# Patient Record
Sex: Male | Born: 1935 | Race: White | Hispanic: No | Marital: Married | Smoking: Never smoker
Health system: Southern US, Community
[De-identification: ages and names within clinical notes are randomized; demographics above are authoritative.]

---

## 2019-10-14 ENCOUNTER — Other Ambulatory Visit: Payer: Self-pay

## 2019-10-14 ENCOUNTER — Emergency Department (HOSPITAL_COMMUNITY): Payer: Medicare Other

## 2019-10-14 ENCOUNTER — Inpatient Hospital Stay (HOSPITAL_COMMUNITY): Payer: Medicare Other

## 2019-10-14 ENCOUNTER — Inpatient Hospital Stay (HOSPITAL_COMMUNITY): Payer: Medicare Other | Admitting: Anesthesiology

## 2019-10-14 ENCOUNTER — Encounter (HOSPITAL_COMMUNITY)
Admission: EM | Disposition: A | Discharge status: Hospice | Payer: Self-pay | Source: Home / Self Care | Attending: Neurology

## 2019-10-14 ENCOUNTER — Inpatient Hospital Stay (HOSPITAL_COMMUNITY)
Admission: EM | Admit: 2019-10-14 | Discharge: 2019-10-19 | DRG: 023 | Disposition: A | Discharge status: Hospice | Payer: Medicare Other | Attending: Neurology | Admitting: Neurology

## 2019-10-14 ENCOUNTER — Encounter (HOSPITAL_COMMUNITY): Payer: Self-pay | Admitting: Neurology

## 2019-10-14 DIAGNOSIS — I10 Essential (primary) hypertension: Secondary | ICD-10-CM | POA: Diagnosis present

## 2019-10-14 DIAGNOSIS — I639 Cerebral infarction, unspecified: Secondary | ICD-10-CM | POA: Diagnosis not present

## 2019-10-14 DIAGNOSIS — Z66 Do not resuscitate: Secondary | ICD-10-CM | POA: Diagnosis not present

## 2019-10-14 DIAGNOSIS — G9340 Encephalopathy, unspecified: Secondary | ICD-10-CM | POA: Diagnosis not present

## 2019-10-14 DIAGNOSIS — D6489 Other specified anemias: Secondary | ICD-10-CM | POA: Diagnosis present

## 2019-10-14 DIAGNOSIS — Z01818 Encounter for other preprocedural examination: Secondary | ICD-10-CM

## 2019-10-14 DIAGNOSIS — E785 Hyperlipidemia, unspecified: Secondary | ICD-10-CM | POA: Diagnosis present

## 2019-10-14 DIAGNOSIS — R471 Dysarthria and anarthria: Secondary | ICD-10-CM | POA: Diagnosis present

## 2019-10-14 DIAGNOSIS — Z515 Encounter for palliative care: Secondary | ICD-10-CM | POA: Diagnosis not present

## 2019-10-14 DIAGNOSIS — R1312 Dysphagia, oropharyngeal phase: Secondary | ICD-10-CM | POA: Diagnosis not present

## 2019-10-14 DIAGNOSIS — Z978 Presence of other specified devices: Secondary | ICD-10-CM

## 2019-10-14 DIAGNOSIS — C159 Malignant neoplasm of esophagus, unspecified: Secondary | ICD-10-CM | POA: Diagnosis not present

## 2019-10-14 DIAGNOSIS — R1310 Dysphagia, unspecified: Secondary | ICD-10-CM | POA: Diagnosis present

## 2019-10-14 DIAGNOSIS — D696 Thrombocytopenia, unspecified: Secondary | ICD-10-CM | POA: Diagnosis present

## 2019-10-14 DIAGNOSIS — R29810 Facial weakness: Secondary | ICD-10-CM | POA: Diagnosis present

## 2019-10-14 DIAGNOSIS — G8194 Hemiplegia, unspecified affecting left nondominant side: Secondary | ICD-10-CM | POA: Diagnosis present

## 2019-10-14 DIAGNOSIS — E43 Unspecified severe protein-calorie malnutrition: Secondary | ICD-10-CM | POA: Diagnosis not present

## 2019-10-14 DIAGNOSIS — I6322 Cerebral infarction due to unspecified occlusion or stenosis of basilar arteries: Secondary | ICD-10-CM | POA: Diagnosis not present

## 2019-10-14 DIAGNOSIS — J9600 Acute respiratory failure, unspecified whether with hypoxia or hypercapnia: Secondary | ICD-10-CM | POA: Diagnosis not present

## 2019-10-14 DIAGNOSIS — R29709 NIHSS score 9: Secondary | ICD-10-CM | POA: Diagnosis present

## 2019-10-14 DIAGNOSIS — G835 Locked-in state: Secondary | ICD-10-CM | POA: Diagnosis not present

## 2019-10-14 DIAGNOSIS — I361 Nonrheumatic tricuspid (valve) insufficiency: Secondary | ICD-10-CM

## 2019-10-14 DIAGNOSIS — Z20822 Contact with and (suspected) exposure to covid-19: Secondary | ICD-10-CM | POA: Diagnosis present

## 2019-10-14 DIAGNOSIS — I651 Occlusion and stenosis of basilar artery: Secondary | ICD-10-CM | POA: Diagnosis not present

## 2019-10-14 DIAGNOSIS — Z923 Personal history of irradiation: Secondary | ICD-10-CM

## 2019-10-14 DIAGNOSIS — I6302 Cerebral infarction due to thrombosis of basilar artery: Secondary | ICD-10-CM | POA: Diagnosis present

## 2019-10-14 DIAGNOSIS — J690 Pneumonitis due to inhalation of food and vomit: Secondary | ICD-10-CM | POA: Diagnosis not present

## 2019-10-14 DIAGNOSIS — J9601 Acute respiratory failure with hypoxia: Secondary | ICD-10-CM | POA: Diagnosis not present

## 2019-10-14 DIAGNOSIS — I63341 Cerebral infarction due to thrombosis of right cerebellar artery: Secondary | ICD-10-CM | POA: Diagnosis present

## 2019-10-14 DIAGNOSIS — R509 Fever, unspecified: Secondary | ICD-10-CM

## 2019-10-14 DIAGNOSIS — I69391 Dysphagia following cerebral infarction: Secondary | ICD-10-CM

## 2019-10-14 DIAGNOSIS — Z7982 Long term (current) use of aspirin: Secondary | ICD-10-CM

## 2019-10-14 DIAGNOSIS — D62 Acute posthemorrhagic anemia: Secondary | ICD-10-CM | POA: Diagnosis not present

## 2019-10-14 DIAGNOSIS — G8250 Quadriplegia, unspecified: Secondary | ICD-10-CM | POA: Diagnosis not present

## 2019-10-14 DIAGNOSIS — Z9221 Personal history of antineoplastic chemotherapy: Secondary | ICD-10-CM

## 2019-10-14 DIAGNOSIS — Z9911 Dependence on respirator [ventilator] status: Secondary | ICD-10-CM | POA: Diagnosis not present

## 2019-10-14 DIAGNOSIS — J988 Other specified respiratory disorders: Secondary | ICD-10-CM | POA: Diagnosis not present

## 2019-10-14 DIAGNOSIS — Z6822 Body mass index (BMI) 22.0-22.9, adult: Secondary | ICD-10-CM

## 2019-10-14 DIAGNOSIS — Z87891 Personal history of nicotine dependence: Secondary | ICD-10-CM

## 2019-10-14 DIAGNOSIS — I672 Cerebral atherosclerosis: Secondary | ICD-10-CM | POA: Diagnosis present

## 2019-10-14 DIAGNOSIS — E876 Hypokalemia: Secondary | ICD-10-CM | POA: Diagnosis not present

## 2019-10-14 DIAGNOSIS — W19XXXA Unspecified fall, initial encounter: Secondary | ICD-10-CM

## 2019-10-14 DIAGNOSIS — D509 Iron deficiency anemia, unspecified: Secondary | ICD-10-CM | POA: Diagnosis present

## 2019-10-14 DIAGNOSIS — Z823 Family history of stroke: Secondary | ICD-10-CM

## 2019-10-14 DIAGNOSIS — Z9289 Personal history of other medical treatment: Secondary | ICD-10-CM

## 2019-10-14 HISTORY — PX: IR PERCUTANEOUS ART THROMBECTOMY/INFUSION INTRACRANIAL INC DIAG ANGIO: IMG6087

## 2019-10-14 HISTORY — PX: RADIOLOGY WITH ANESTHESIA: SHX6223

## 2019-10-14 HISTORY — PX: IR CT HEAD LTD: IMG2386

## 2019-10-14 HISTORY — PX: IR US GUIDE VASC ACCESS RIGHT: IMG2390

## 2019-10-14 HISTORY — PX: IR ANGIO INTRA EXTRACRAN SEL COM CAROTID INNOMINATE UNI R MOD SED: IMG5359

## 2019-10-14 HISTORY — PX: IR ANGIO VERTEBRAL SEL SUBCLAVIAN INNOMINATE UNI L MOD SED: IMG5364

## 2019-10-14 LAB — APTT: aPTT: 25 seconds (ref 24–36)

## 2019-10-14 LAB — DIFFERENTIAL
Abs Immature Granulocytes: 0.03 10*3/uL (ref 0.00–0.07)
Basophils Absolute: 0 10*3/uL (ref 0.0–0.1)
Basophils Relative: 0 %
Eosinophils Absolute: 0.1 10*3/uL (ref 0.0–0.5)
Eosinophils Relative: 2 %
Immature Granulocytes: 0 %
Lymphocytes Relative: 5 %
Lymphs Abs: 0.4 10*3/uL — ABNORMAL LOW (ref 0.7–4.0)
Monocytes Absolute: 0.6 10*3/uL (ref 0.1–1.0)
Monocytes Relative: 8 %
Neutro Abs: 6.6 10*3/uL (ref 1.7–7.7)
Neutrophils Relative %: 85 %

## 2019-10-14 LAB — COMPREHENSIVE METABOLIC PANEL
ALT: 33 U/L (ref 0–44)
AST: 37 U/L (ref 15–41)
Albumin: 4 g/dL (ref 3.5–5.0)
Alkaline Phosphatase: 88 U/L (ref 38–126)
Anion gap: 16 — ABNORMAL HIGH (ref 5–15)
BUN: 25 mg/dL — ABNORMAL HIGH (ref 8–23)
CO2: 20 mmol/L — ABNORMAL LOW (ref 22–32)
Calcium: 9.4 mg/dL (ref 8.9–10.3)
Chloride: 103 mmol/L (ref 98–111)
Creatinine, Ser: 0.99 mg/dL (ref 0.61–1.24)
GFR calc Af Amer: >60 mL/min (ref >60)
GFR calc non Af Amer: >60 mL/min (ref >60)
Glucose, Bld: 129 mg/dL — ABNORMAL HIGH (ref 70–99)
Potassium: 4.6 mmol/L (ref 3.5–5.1)
Sodium: 139 mmol/L (ref 135–145)
Total Bilirubin: 0.6 mg/dL (ref 0.3–1.2)
Total Protein: 6.8 g/dL (ref 6.5–8.1)

## 2019-10-14 LAB — POCT I-STAT 7, (LYTES, BLD GAS, ICA,H+H)
Acid-base deficit: 6 mmol/L — ABNORMAL HIGH (ref 0.0–2.0)
Bicarbonate: 19.4 mmol/L — ABNORMAL LOW (ref 20.0–28.0)
Calcium, Ion: 1.16 mmol/L (ref 1.15–1.40)
HCT: 28 % — ABNORMAL LOW (ref 39.0–52.0)
Hemoglobin: 9.5 g/dL — ABNORMAL LOW (ref 13.0–17.0)
O2 Saturation: 98 %
Patient temperature: 97.5
Potassium: 3.8 mmol/L (ref 3.5–5.1)
Sodium: 139 mmol/L (ref 135–145)
TCO2: 20 mmol/L — ABNORMAL LOW (ref 22–32)
pCO2 arterial: 33.6 mmHg (ref 32.0–48.0)
pH, Arterial: 7.366 (ref 7.350–7.450)
pO2, Arterial: 103 mmHg (ref 83.0–108.0)

## 2019-10-14 LAB — CBC
HCT: 35.5 % — ABNORMAL LOW (ref 39.0–52.0)
Hemoglobin: 11.9 g/dL — ABNORMAL LOW (ref 13.0–17.0)
MCH: 29.7 pg (ref 26.0–34.0)
MCHC: 33.5 g/dL (ref 30.0–36.0)
MCV: 88.5 fL (ref 80.0–100.0)
Platelets: 173 10*3/uL (ref 150–400)
RBC: 4.01 MIL/uL — ABNORMAL LOW (ref 4.22–5.81)
RDW: 14.8 % (ref 11.5–15.5)
WBC: 7.7 10*3/uL (ref 4.0–10.5)
nRBC: 0 % (ref 0.0–0.2)

## 2019-10-14 LAB — MRSA PCR SCREENING: MRSA by PCR: NEGATIVE

## 2019-10-14 LAB — PROTIME-INR
INR: 1.1 (ref 0.8–1.2)
Prothrombin Time: 14.3 seconds (ref 11.4–15.2)

## 2019-10-14 LAB — ECHOCARDIOGRAM COMPLETE
Height: 66 in
Weight: 2186.96 oz

## 2019-10-14 LAB — RESPIRATORY PANEL BY RT PCR (FLU A&B, COVID)
Influenza A by PCR: NEGATIVE
Influenza B by PCR: NEGATIVE
SARS Coronavirus 2 by RT PCR: NEGATIVE

## 2019-10-14 LAB — CBG MONITORING, ED
Glucose-Capillary: 125 mg/dL — ABNORMAL HIGH (ref 70–99)
Glucose-Capillary: 119 mg/dL — ABNORMAL HIGH (ref 70–99)

## 2019-10-14 LAB — ETHANOL: Alcohol, Ethyl (B): <10 mg/dL (ref <10)

## 2019-10-14 SURGERY — IR WITH ANESTHESIA
Anesthesia: General

## 2019-10-14 MED ORDER — ASPIRIN 81 MG PO CHEW
81.0000 mg | CHEWABLE_TABLET | Freq: Every day | ORAL | Status: DC
  Administered 2019-10-15: 11:00:00 81 mg
  Filled 2019-10-14 (×2): qty 1

## 2019-10-14 MED ORDER — LIDOCAINE HCL (CARDIAC) PF 100 MG/5ML IV SOSY: PREFILLED_SYRINGE | INTRAVENOUS | Status: DC | PRN

## 2019-10-14 MED ORDER — CHLORHEXIDINE GLUCONATE 0.12% ORAL RINSE (MEDLINE KIT)
15.0000 mL | Freq: Two times a day (BID) | OROMUCOSAL | Status: DC
  Administered 2019-10-14 – 2019-10-17 (×7): 15 mL via OROMUCOSAL

## 2019-10-14 MED ORDER — CEFAZOLIN SODIUM-DEXTROSE 2-3 GM-%(50ML) IV SOLR
INTRAVENOUS | Status: DC | PRN
  Administered 2019-10-14: 2 g via INTRAVENOUS

## 2019-10-14 MED ORDER — PHENYLEPHRINE HCL-NACL 10-0.9 MG/250ML-% IV SOLN: 0.0000 ug/min | INTRAVENOUS | Status: DC

## 2019-10-14 MED ORDER — ROCURONIUM BROMIDE 100 MG/10ML IV SOLN
INTRAVENOUS | Status: DC | PRN
  Administered 2019-10-14: 50 mg via INTRAVENOUS
  Administered 2019-10-14: 100 mg via INTRAVENOUS

## 2019-10-14 MED ORDER — EPTIFIBATIDE 20 MG/10ML IV SOLN
INTRAVENOUS | Status: DC | PRN
  Administered 2019-10-14 (×4): 1.5 mg via INTRAVENOUS

## 2019-10-14 MED ORDER — ACETAMINOPHEN 650 MG RE SUPP: 650.0000 mg | RECTAL | Status: DC | PRN

## 2019-10-14 MED ORDER — CLEVIDIPINE BUTYRATE 0.5 MG/ML IV EMUL
0.0000 mg/h | INTRAVENOUS | Status: DC
  Administered 2019-10-14: 14:00:00 8 mg/h via INTRAVENOUS
  Administered 2019-10-14: 12:00:00 6 mg/h via INTRAVENOUS
  Filled 2019-10-14: qty 50

## 2019-10-14 MED ORDER — IOHEXOL 300 MG/ML  SOLN
150.0000 mL | Freq: Once | INTRAMUSCULAR | Status: AC | PRN
  Administered 2019-10-14: 09:00:00 50 mL via INTRA_ARTERIAL

## 2019-10-14 MED ORDER — SODIUM CHLORIDE 0.9 % IV SOLN: INTRAVENOUS | Status: DC | PRN

## 2019-10-14 MED ORDER — ASPIRIN 300 MG RE SUPP: 300.0000 mg | Freq: Every day | RECTAL | Status: DC

## 2019-10-14 MED ORDER — VERAPAMIL HCL 2.5 MG/ML IV SOLN
INTRAVENOUS | Status: DC | PRN
  Administered 2019-10-14: 2.5 mg via INTRA_ARTERIAL

## 2019-10-14 MED ORDER — PANTOPRAZOLE SODIUM 40 MG PO PACK
40.0000 mg | PACK | Freq: Every day | ORAL | Status: DC
  Administered 2019-10-14 – 2019-10-15 (×2): 40 mg
  Filled 2019-10-14 (×2): qty 20

## 2019-10-14 MED ORDER — ASPIRIN 81 MG PO CHEW: 81.0000 mg | CHEWABLE_TABLET | Freq: Every day | ORAL | Status: DC

## 2019-10-14 MED ORDER — ACETAMINOPHEN 160 MG/5ML PO SOLN: 650.0000 mg | ORAL | Status: DC | PRN

## 2019-10-14 MED ORDER — PHENYLEPHRINE 40 MCG/ML (10ML) SYRINGE FOR IV PUSH (FOR BLOOD PRESSURE SUPPORT)
PREFILLED_SYRINGE | INTRAVENOUS | Status: DC | PRN
  Administered 2019-10-14: 80 ug via INTRAVENOUS
  Administered 2019-10-14: 120 ug via INTRAVENOUS

## 2019-10-14 MED ORDER — ASPIRIN 81 MG PO CHEW
CHEWABLE_TABLET | ORAL | Status: DC | PRN
  Administered 2019-10-14: 81 mg

## 2019-10-14 MED ORDER — HEPARIN SODIUM (PORCINE) 1000 UNIT/ML IJ SOLN
INTRAMUSCULAR | Status: AC
  Filled 2019-10-14: qty 1

## 2019-10-14 MED ORDER — PROPOFOL 500 MG/50ML IV EMUL
INTRAVENOUS | Status: DC | PRN
  Administered 2019-10-14: 75 ug/kg/min via INTRAVENOUS

## 2019-10-14 MED ORDER — SODIUM CHLORIDE (PF) 0.9 % IJ SOLN
INTRAVENOUS | Status: DC | PRN
  Administered 2019-10-14 (×2): 200 ug via INTRA_ARTERIAL

## 2019-10-14 MED ORDER — NITROGLYCERIN 1 MG/10 ML FOR IR/CATH LAB
INTRA_ARTERIAL | Status: AC
  Filled 2019-10-14: qty 10

## 2019-10-14 MED ORDER — PROPOFOL 1000 MG/100ML IV EMUL
0.0000 ug/kg/min | INTRAVENOUS | Status: DC
  Administered 2019-10-14: 17:00:00 30 ug/kg/min via INTRAVENOUS
  Administered 2019-10-14: 16:00:00 15 ug/kg/min via INTRAVENOUS
  Administered 2019-10-15: 11:00:00 10 ug/kg/min via INTRAVENOUS
  Administered 2019-10-15: 50 ug/kg/min via INTRAVENOUS
  Filled 2019-10-14 (×3): qty 100

## 2019-10-14 MED ORDER — STROKE: EARLY STAGES OF RECOVERY BOOK
Freq: Once | Status: DC
  Filled 2019-10-14: qty 1

## 2019-10-14 MED ORDER — CLEVIDIPINE BUTYRATE 0.5 MG/ML IV EMUL
INTRAVENOUS | Status: AC
  Filled 2019-10-14: qty 50

## 2019-10-14 MED ORDER — SENNOSIDES-DOCUSATE SODIUM 8.6-50 MG PO TABS: 1.0000 | ORAL_TABLET | Freq: Every evening | ORAL | Status: DC | PRN

## 2019-10-14 MED ORDER — TICAGRELOR 90 MG PO TABS: 90.0000 mg | ORAL_TABLET | Freq: Two times a day (BID) | ORAL | Status: DC

## 2019-10-14 MED ORDER — ASPIRIN 81 MG PO CHEW
CHEWABLE_TABLET | ORAL | Status: AC
  Filled 2019-10-14: qty 1

## 2019-10-14 MED ORDER — IOHEXOL 300 MG/ML  SOLN
150.0000 mL | Freq: Once | INTRAMUSCULAR | Status: AC | PRN
  Administered 2019-10-14: 08:00:00 75 mL via INTRA_ARTERIAL

## 2019-10-14 MED ORDER — FENTANYL CITRATE (PF) 100 MCG/2ML IJ SOLN: 25.0000 ug | INTRAMUSCULAR | Status: DC | PRN

## 2019-10-14 MED ORDER — LIDOCAINE 2% (20 MG/ML) 5 ML SYRINGE
INTRAMUSCULAR | Status: DC | PRN
  Administered 2019-10-14: 60 mg via INTRAVENOUS

## 2019-10-14 MED ORDER — SODIUM CHLORIDE 0.9 % IV SOLN
INTRAVENOUS | Status: DC
  Administered 2019-10-16: 21:00:00 1000 mL via INTRAVENOUS

## 2019-10-14 MED ORDER — TICAGRELOR 90 MG PO TABS
ORAL_TABLET | ORAL | Status: AC
  Filled 2019-10-14: qty 2

## 2019-10-14 MED ORDER — ACETAMINOPHEN 160 MG/5ML PO SOLN
650.0000 mg | ORAL | Status: DC | PRN
Start: 2019-10-14 — End: 2019-10-15
  Administered 2019-10-15: 650 mg
  Filled 2019-10-14: qty 20.3

## 2019-10-14 MED ORDER — TICAGRELOR 90 MG PO TABS
90.0000 mg | ORAL_TABLET | Freq: Two times a day (BID) | ORAL | Status: DC
  Administered 2019-10-14 – 2019-10-15 (×2): 90 mg
  Filled 2019-10-14 (×2): qty 1

## 2019-10-14 MED ORDER — PROPOFOL 10 MG/ML IV BOLUS
INTRAVENOUS | Status: DC | PRN
  Administered 2019-10-14: 110 ug via INTRAVENOUS
  Administered 2019-10-14: 90 ug via INTRAVENOUS

## 2019-10-14 MED ORDER — PHENYLEPHRINE HCL-NACL 10-0.9 MG/250ML-% IV SOLN
INTRAVENOUS | Status: DC | PRN
  Administered 2019-10-14: 15 ug/min via INTRAVENOUS

## 2019-10-14 MED ORDER — ACETAMINOPHEN 650 MG RE SUPP
650.0000 mg | RECTAL | Status: DC | PRN
  Administered 2019-10-16 (×2): 650 mg via RECTAL
  Filled 2019-10-14 (×2): qty 1

## 2019-10-14 MED ORDER — ORAL CARE MOUTH RINSE
15.0000 mL | OROMUCOSAL | Status: DC
  Administered 2019-10-14 – 2019-10-19 (×42): 15 mL via OROMUCOSAL

## 2019-10-14 MED ORDER — VERAPAMIL HCL 2.5 MG/ML IV SOLN
INTRAVENOUS | Status: AC
  Filled 2019-10-14: qty 2

## 2019-10-14 MED ORDER — ONDANSETRON HCL 4 MG/2ML IJ SOLN: 4.0000 mg | Freq: Four times a day (QID) | INTRAMUSCULAR | Status: DC | PRN

## 2019-10-14 MED ORDER — CHLORHEXIDINE GLUCONATE CLOTH 2 % EX PADS
6.0000 | MEDICATED_PAD | Freq: Every day | CUTANEOUS | Status: DC
  Administered 2019-10-14: 12:00:00 6 via TOPICAL

## 2019-10-14 MED ORDER — ACETAMINOPHEN 325 MG PO TABS: 650.0000 mg | ORAL_TABLET | ORAL | Status: DC | PRN

## 2019-10-14 MED ORDER — SUCCINYLCHOLINE CHLORIDE 20 MG/ML IJ SOLN
INTRAMUSCULAR | Status: DC | PRN
  Administered 2019-10-14: 20 mg via INTRAVENOUS

## 2019-10-14 MED ORDER — TICAGRELOR 60 MG PO TABS
ORAL_TABLET | ORAL | Status: DC | PRN
  Administered 2019-10-14: 180 mg via NASOGASTRIC

## 2019-10-14 MED ORDER — IOHEXOL 300 MG/ML  SOLN
150.0000 mL | Freq: Once | INTRAMUSCULAR | Status: AC | PRN
  Administered 2019-10-14: 11:00:00 50 mL via INTRA_ARTERIAL

## 2019-10-14 MED ORDER — HEPARIN SODIUM (PORCINE) 1000 UNIT/ML IJ SOLN
INTRAMUSCULAR | Status: DC | PRN
  Administered 2019-10-14: 2000 [IU] via INTRA_ARTERIAL

## 2019-10-14 MED ORDER — EPTIFIBATIDE 20 MG/10ML IV SOLN
INTRAVENOUS | Status: AC
  Filled 2019-10-14: qty 10

## 2019-10-14 MED ORDER — IOHEXOL 350 MG/ML SOLN
100.0000 mL | Freq: Once | INTRAVENOUS | Status: AC | PRN
  Administered 2019-10-14: 100 mL via INTRAVENOUS

## 2019-10-14 MED ORDER — ASPIRIN 325 MG PO TABS: 325.0000 mg | ORAL_TABLET | Freq: Every day | ORAL | Status: DC

## 2019-10-14 MED ORDER — CEFAZOLIN SODIUM-DEXTROSE 2-4 GM/100ML-% IV SOLN
INTRAVENOUS | Status: AC
  Filled 2019-10-14: qty 100

## 2019-10-14 MED ORDER — SODIUM CHLORIDE 0.9 % IV SOLN: INTRAVENOUS | Status: DC

## 2019-10-14 NOTE — Progress Notes (Signed)
STROKE TEAM PROGRESS NOTE   INTERVAL HISTORY I personally reviewed history of presenting illness, electronic medical records and imaging films in PACS.  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  Remains intubated postprocedure and sedated.  Blood pressure adequately controlled and Cardene drip has been started.  No family available at the bedside.  Vitals:   10/14/19 0615 10/14/19 0641 10/14/19 0643 10/14/19 0646  BP: (!) 169/76   (!) 164/79  Pulse:   (!) 122   Resp: (!) 25  18   Temp:   (!) 97.5 F (36.4 C)   SpO2:    96%  Weight:  59 kg    Height:  5\' 6"  (1.676 m)      CBC:  Recent Labs  Lab 10/14/19 0503  WBC 7.7  NEUTROABS 6.6  HGB 11.9*  HCT 35.5*  MCV 88.5  PLT [id number]    Basic Metabolic Panel:  Recent Labs  Lab 10/14/19 0503  NA 139  K 4.6  CL 103  CO2 20*  GLUCOSE 129*  BUN 25*  CREATININE 0.99  CALCIUM 9.4   Lipid Panel: No results found for: CHOL, TRIG, HDL, CHOLHDL, VLDL, LDLCALC HgbA1c: No results found for: HGBA1C Urine Drug Screen: No results found for: LABOPIA, COCAINSCRNUR, LABBENZ, AMPHETMU, THCU, LABBARB  Alcohol Level     Component Value Date/Time   ETH <10 10/14/2019 0503    IMAGING past 48 hours CT Code Stroke CTA Head W/WO contrast  Result Date: 10/14/2019 CLINICAL DATA:  Slurred speech and left-sided weakness EXAM: CT ANGIOGRAPHY HEAD AND NECK CT PERFUSION BRAIN TECHNIQUE: Multidetector CT imaging of the head and neck was performed using the standard protocol during bolus administration of intravenous contrast. Multiplanar CT image reconstructions and MIPs were obtained to evaluate the vascular anatomy. Carotid stenosis measurements (when applicable) are obtained utilizing NASCET criteria, using the distal internal carotid diameter as the denominator. Multiphase CT imaging of the brain was performed following IV bolus contrast injection. Subsequent parametric perfusion  maps were calculated using RAPID software. CONTRAST:  Dose is currently not available COMPARISON:  Noncontrast head CT earlier today FINDINGS: CTA NECK FINDINGS Aortic arch: Atherosclerotic plaque that is extensive. Four vessel branching. Right carotid system: Relatively mild atherosclerotic plaque at the bifurcation without flow limiting stenosis. No ulceration. Left carotid system: Partial retropharyngeal course. Relatively mild atherosclerotic calcification mainly at the bifurcation without flow limiting stenosis or ulceration. Vertebral arteries: Left vertebral artery arises from the arch. Moderate atheromatous type narrowing at the right V1 segment. Both vertebral arteries are patent at the level of the dural penetration. Skeleton: Spinal degeneration and spondylosis. No acute or aggressive finding Other neck: Port in place. Upper chest: Distended esophagus containing semi solid appearing material were covered. Review of the MIP images confirms the above findings CTA HEAD FINDINGS Anterior circulation: Atherosclerotic plaque along the carotid siphons. No branch occlusion, beading, or aneurysm. Posterior circulation: Dominant right vertebral artery. There is high-grade narrowing of the left V4 segment. Although the basilar is likely hypoplastic in the setting of fetal type left PCA, there is superimposed thready flow followed by occlusion with no flow seen at the basilar tip or throughout the large majority of the right PCA. High-grade left P2 segment stenosis. Venous sinuses: Patent Anatomic variants: As above Review of the MIP images confirms the above findings CT Brain Perfusion Findings: ASPECTS: 10 CBF (<30%) Volume: 72mL Perfusion (Tmax>6.0s) volume: 40mL-at the level of the pons Critical Value/emergent results were called  by telephone at the time of interpretation on 10/14/2019 at 5:28 am to [place] , who verbally acknowledged these results. IMPRESSION: 1. Occluded mid to distal basilar  and right PCA. There is correlative abnormal perfusion at the level of the upper pons. 2. High-grade left P2 and left V4 segment stenoses. 3. No flow limiting stenosis or large vessel occlusion in the anterior circulation. 4. Distended upper thoracic esophagus likely containing debris. Electronically Signed   By: Monte Fantasia M.D.   On: 10/14/2019 05:32   CT Code Stroke CTA Neck W/WO contrast  Result Date: 10/14/2019 CLINICAL DATA:  Slurred speech and left-sided weakness EXAM: CT ANGIOGRAPHY HEAD AND NECK CT PERFUSION BRAIN TECHNIQUE: Multidetector CT imaging of the head and neck was performed using the standard protocol during bolus administration of intravenous contrast. Multiplanar CT image reconstructions and MIPs were obtained to evaluate the vascular anatomy. Carotid stenosis measurements (when applicable) are obtained utilizing NASCET criteria, using the distal internal carotid diameter as the denominator. Multiphase CT imaging of the brain was performed following IV bolus contrast injection. Subsequent parametric perfusion maps were calculated using RAPID software. CONTRAST:  Dose is currently not available COMPARISON:  Noncontrast head CT earlier today FINDINGS: CTA NECK FINDINGS Aortic arch: Atherosclerotic plaque that is extensive. Four vessel branching. Right carotid system: Relatively mild atherosclerotic plaque at the bifurcation without flow limiting stenosis. No ulceration. Left carotid system: Partial retropharyngeal course. Relatively mild atherosclerotic calcification mainly at the bifurcation without flow limiting stenosis or ulceration. Vertebral arteries: Left vertebral artery arises from the arch. Moderate atheromatous type narrowing at the right V1 segment. Both vertebral arteries are patent at the level of the dural penetration. Skeleton: Spinal degeneration and spondylosis. No acute or aggressive finding Other neck: Port in place. Upper chest: Distended esophagus containing semi solid  appearing material were covered. Review of the MIP images confirms the above findings CTA HEAD FINDINGS Anterior circulation: Atherosclerotic plaque along the carotid siphons. No branch occlusion, beading, or aneurysm. Posterior circulation: Dominant right vertebral artery. There is high-grade narrowing of the left V4 segment. Although the basilar is likely hypoplastic in the setting of fetal type left PCA, there is superimposed thready flow followed by occlusion with no flow seen at the basilar tip or throughout the large majority of the right PCA. High-grade left P2 segment stenosis. Venous sinuses: Patent Anatomic variants: As above Review of the MIP images confirms the above findings CT Brain Perfusion Findings: ASPECTS: 10 CBF (<30%) Volume: 21mL Perfusion (Tmax>6.0s) volume: 59mL-at the level of the pons Critical Value/emergent results were called by telephone at the time of interpretation on 10/14/2019 at 5:28 am to [place] , who verbally acknowledged these results. IMPRESSION: 1. Occluded mid to distal basilar and right PCA. There is correlative abnormal perfusion at the level of the upper pons. 2. High-grade left P2 and left V4 segment stenoses. 3. No flow limiting stenosis or large vessel occlusion in the anterior circulation. 4. Distended upper thoracic esophagus likely containing debris. Electronically Signed   By: Monte Fantasia M.D.   On: 10/14/2019 05:32   CT C-SPINE NO CHARGE  Result Date: 10/14/2019 CLINICAL DATA:  Neurologic deficits, fall EXAM: CT CERVICAL SPINE WITHOUT CONTRAST TECHNIQUE: Multidetector CT imaging of the cervical spine was performed without intravenous contrast. Multiplanar CT image reconstructions were also generated. COMPARISON:  Same day CT angiography of the head neck FINDINGS: Alignment: Slight exaggeration of the normal cervical lordosis. Mild anterolisthesis of C4 on C5 likely on a facet degenerative basis. No  abnormally perched, jump to widened facets.  Craniocervical into axial alignment is maintained. Skull base and vertebrae: Degenerative appearing fusion of the left articular facet at C2-3. Likely degenerative fusion of the right articular facets at C5-6 as well. No acute fracture. No primary bone lesion or focal pathologic process. Soft tissues and spinal canal: No pre or paravertebral fluid or swelling. No visible canal hematoma. Disc levels: Multilevel moderate to severe intervertebral disc height loss with vacuum phenomenon at C3-4 and C6-7 as well as multilevel cervical spondylitic endplate changes with advanced uncinate spurring and facet hypertrophic changes well. Findings result in at least moderate canal stenosis at C5-6 and C6-7 as well as multilevel mild-to-moderate neural foraminal narrowing. Degenerative changes are milder in the upper thoracic spine. Upper chest: Secretions noted in the posterior oropharynx. Debris noted in the upper thoracic esophagus. Biapical pleuroparenchymal scarring is present. Other: Thyroid gland is unremarkable. Cervical carotid atherosclerosis better assessed on CT angiography. IMPRESSION: 1. No acute osseous abnormality or traumatic listhesis. 2. Slight exaggeration of the normal cervical lordosis with mild anterolisthesis of C4 on C5 likely on a facet degenerative basis. Degenerative fusion of the left articular facet at C2-3 and right articular facet at C5-6. 3. Multilevel moderate to severe cervical spondylosis with advanced uncinate spurring and facet hypertrophic changes resulting in at least moderate canal stenosis at C5-6 and C6-7 as well as multilevel mild-to-moderate neural foraminal narrowing. 4. Secretions noted in the upper thoracic esophagus. Correlate for reflux. 5. Cervical carotid atherosclerosis better assessed on CT angiography. Electronically Signed   By: Lovena Le M.D.   On: 10/14/2019 05:57   CT Code Stroke Cerebral Perfusion with contrast  Result Date: 10/14/2019 CLINICAL DATA:  Slurred  speech and left-sided weakness EXAM: CT ANGIOGRAPHY HEAD AND NECK CT PERFUSION BRAIN TECHNIQUE: Multidetector CT imaging of the head and neck was performed using the standard protocol during bolus administration of intravenous contrast. Multiplanar CT image reconstructions and MIPs were obtained to evaluate the vascular anatomy. Carotid stenosis measurements (when applicable) are obtained utilizing NASCET criteria, using the distal internal carotid diameter as the denominator. Multiphase CT imaging of the brain was performed following IV bolus contrast injection. Subsequent parametric perfusion maps were calculated using RAPID software. CONTRAST:  Dose is currently not available COMPARISON:  Noncontrast head CT earlier today FINDINGS: CTA NECK FINDINGS Aortic arch: Atherosclerotic plaque that is extensive. Four vessel branching. Right carotid system: Relatively mild atherosclerotic plaque at the bifurcation without flow limiting stenosis. No ulceration. Left carotid system: Partial retropharyngeal course. Relatively mild atherosclerotic calcification mainly at the bifurcation without flow limiting stenosis or ulceration. Vertebral arteries: Left vertebral artery arises from the arch. Moderate atheromatous type narrowing at the right V1 segment. Both vertebral arteries are patent at the level of the dural penetration. Skeleton: Spinal degeneration and spondylosis. No acute or aggressive finding Other neck: Port in place. Upper chest: Distended esophagus containing semi solid appearing material were covered. Review of the MIP images confirms the above findings CTA HEAD FINDINGS Anterior circulation: Atherosclerotic plaque along the carotid siphons. No branch occlusion, beading, or aneurysm. Posterior circulation: Dominant right vertebral artery. There is high-grade narrowing of the left V4 segment. Although the basilar is likely hypoplastic in the setting of fetal type left PCA, there is superimposed thready flow  followed by occlusion with no flow seen at the basilar tip or throughout the large majority of the right PCA. High-grade left P2 segment stenosis. Venous sinuses: Patent Anatomic variants: As above Review of the MIP images confirms  the above findings CT Brain Perfusion Findings: ASPECTS: 10 CBF (<30%) Volume: 54mL Perfusion (Tmax>6.0s) volume: 22mL-at the level of the pons Critical Value/emergent results were called by telephone at the time of interpretation on 10/14/2019 at 5:28 am to [place] , who verbally acknowledged these results. IMPRESSION: 1. Occluded mid to distal basilar and right PCA. There is correlative abnormal perfusion at the level of the upper pons. 2. High-grade left P2 and left V4 segment stenoses. 3. No flow limiting stenosis or large vessel occlusion in the anterior circulation. 4. Distended upper thoracic esophagus likely containing debris. Electronically Signed   By: Monte Fantasia M.D.   On: 10/14/2019 05:32   CT HEAD CODE STROKE WO CONTRAST  Result Date: 10/14/2019 CLINICAL DATA:  Code stroke.  Slurred speech and left-sided weakness EXAM: CT HEAD WITHOUT CONTRAST TECHNIQUE: Contiguous axial images were obtained from the base of the skull through the vertex without intravenous contrast. COMPARISON:  None. FINDINGS: Brain: No evidence of acute infarction, hemorrhage, hydrocephalus, extra-axial collection or mass lesion/mass effect. Mild chronic small vessel ischemia and volume loss for age. Vascular: No hyperdense vessel or unexpected calcification. Skull: Normal. Negative for fracture or focal lesion. Sinuses/Orbits: Negative Other: These results were communicated to Dr. Leonel Ramsay at 5:12 amon 1/7/2021by text page via the [facility] messaging system. ASPECTS [facility] Stroke Program Early CT Score) - Ganglionic level infarction (caudate, lentiform nuclei, internal capsule, insula, M1-M3 cortex): 7 - Supraganglionic infarction (M4-M6 cortex): 3 Total score (0-10 with 10 being  normal): 10 IMPRESSION: No acute finding.  ASPECTS is 10. Electronically Signed   By: Monte Fantasia M.D.   On: 10/14/2019 05:13   Cerebral Angio 10/14/2019 S/P revascularization of occluded prox basilar artery with intracranial stentin for underlying severe ICAD. RT radial approach. Bilateral CCA and bilateral VERT artery angiograms.  PHYSICAL EXAM Patient is sedated and intubated.  Not in distress. . Afebrile. Head is nontraumatic. Neck is supple without bruit.    Cardiac exam no murmur or gallop. Lungs are clear to auscultation. Distal pulses are well felt. Neurological Exam :  Patient is sedated and intubated.  Eyes are closed.  Partially opens eyes to sternal rub.  Eyes in primary position.  Pupils are equal reactive.  Doll's eye movements are sluggish.  Corneal reflexes are sluggish.  He has a weak cough and gag.  He has some spontaneous left upper and lower extremity movements which are semipurposeful.  He has right hemiplegia.  There is minimum withdrawal in the right lower extremity and none in the right upper extremity to painful stimulus.  Deep tendon reflexes are depressed.  Plantars are equivocal. ASSESSMENT/PLAN [name] is a 86 y.o. male with history of esophageal cancer presenting with L sided weakness following a fall. Taken to IR for BA occlusion.   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 ASPECTS 10.     CTA head & neck mid to distal BA occlusion and R PCA occlusion. High-grade L P2 and L V4 stenoses.   CT perfusion abnormal level of upper pons  CT CS no trauma noted. Slight exaggeration of cervical lordosis. Fusion L  C2-3 and R C5-6. Moderate to severe cervical spondylosis w/ moderate canal stenosis C5-6 and C6-7.  Cerebral angio proximal BA occlusion s/p stent for intracranial atherosclerosis   Post IR CT no evidence of ICH or mass effect or vent prominence  MRI  pending    2D Echo  pending   LDL pending   HgbA1c pending   SCDs for VTE prophylaxis  no antithrombotics prior to admission, now on aspirin 81 mg daily and Brilinta (ticagrelor) 90 mg bid.   Therapy recommendations:  pending   Disposition:  pending   Acute Respiratory Failure  Intubated for IR  Left intubated for airway protection  Blood Pressure  No hx HTN  Not on home HTN meds   BP goal per IR x 24h following IR procedure   Dysphagia . Secondary to stroke . NPO  Other Stroke Risk Factors  Advanced age  Former smoker, quit 1979  Former smokeless tobacco user  Family hx stroke (father)  Other Active Problems  Esophageal cancer T2 N0 M0 adenocarcinoma 2019 w/ recurrence and T2 lesion not amenable to resection -currently undergoing chemotherapy with FOLFOX and Herceptin at [facility]  Acute blood loss anemia post IR, Hgb 11.9->9.5  Fe deficiency anemia on po iron  Hx GIB yrs ago  Hospital day # 0 I have personally obtained history,examined this patient, reviewed notes, independently viewed imaging studies, participated in medical decision making and plan of care.ROS completed by me personally and pertinent positives fully documented  I have made any additions or clarifications directly to the above note.  He presented with dysarthria dysphagia from basilar artery occlusion and underwent successful mechanical thrombectomy with revascularization but needing basilar artery stenting.  Continue close neurological monitoring and strict blood pressure control as per post intervention protocol.  Check MRI scan of the brain later today.  Decision about extubation will need to be made only after discussion with family and reviewing MRI results and knowing goals of care.  Discussed with critical care team.   This patient is critically ill and at significant risk of neurological worsening, death and care requires constant monitoring of vital signs, hemodynamics,respiratory and cardiac  monitoring, extensive review of multiple databases, frequent neurological assessment, discussion with family, other specialists and medical decision making of high complexity.I have made any additions or clarifications directly to the above note.This critical care time does not reflect procedure time, or teaching time or supervisory time of PA/NP/Med Resident etc but could involve care discussion time.  I spent 40 minutes of neurocritical care time  in the care of  this patient.      Antony Contras, MD Medical Director [facility] Stroke Center Pager: [phone] 10/14/2019 4:14 PM   To contact Stroke Continuity provider, please refer to [email or web address] After hours, contact General Neurology

## 2019-10-14 NOTE — Progress Notes (Signed)
Pt ETT advanced 3cm per MD order. Pt was 22 @ the lips and is now 25 @ the lips. Pt saturations stayed at 100%. RT will continue to monitor.

## 2019-10-14 NOTE — Anesthesia Postprocedure Evaluation (Signed)
Anesthesia Post Note  Patient: Larry Beltran  Procedure(s) Performed: IR WITH ANESTHESIA (N/A )     Patient location during evaluation: ICU Anesthesia Type: General Level of consciousness: sedated and patient remains intubated per anesthesia plan Pain management: pain level controlled Vital Signs Assessment: post-procedure vital signs reviewed and stable Respiratory status: patient remains intubated per anesthesia plan Cardiovascular status: stable Postop Assessment: no apparent nausea or vomiting Anesthetic complications: no    Last Vitals:  Vitals:   10/14/19 0643 10/14/19 0646  BP:  (!) 164/79  Pulse: (!) 122   Resp: 18   Temp: (!) 36.4 C   SpO2:  96%    Last Pain:  Vitals:   10/14/19 0647  PainSc: 0-No pain                 Audry Pili

## 2019-10-14 NOTE — Progress Notes (Signed)
PT Cancellation Note  Patient Details Name: Larry Beltran MRN: [id number] DOB: 10-03-1932   Cancelled Treatment:    Reason Eval/Treat Not Completed: Patient at procedure or test/unavailable; patient off the floor in IR for thrombectomy.  Will attempt another day.    Reginia Naas 10/14/2019, 8:46 AM  Magda Kiel, [place] [phone] 10/14/2019

## 2019-10-14 NOTE — Transfer of Care (Signed)
Immediate Anesthesia Transfer of Care Note  Patient: Larry Beltran  Procedure(s) Performed: IR WITH ANESTHESIA (N/A )  Patient Location: PACU and ICU  Anesthesia Type:General  Level of Consciousness: sedated and Patient remains intubated per anesthesia plan  Airway & Oxygen Therapy: Patient remains intubated per anesthesia plan and Patient placed on Ventilator (see vital sign flow sheet for setting)  Post-op Assessment: Report given to RN and Post -op Vital signs reviewed and stable  Post vital signs: Reviewed and stable  Last Vitals:  Vitals Value Taken Time  BP 110/73 10/14/19 1115  Temp    Pulse 83 10/14/19 1119  Resp 15 10/14/19 1119  SpO2 100 % 10/14/19 1119  Vitals shown include unvalidated device data.  Last Pain:  Vitals:   10/14/19 0647  PainSc: 0-No pain         Complications: No apparent anesthesia complications

## 2019-10-14 NOTE — ED Triage Notes (Signed)
The pt arrived by gems with a code stroke   He was lsn at2130  He was found on the floor approx  0437   A code stroke was called after that.  On arrival  Ems reports slurred speech and facial droop less lt arm movement also

## 2019-10-14 NOTE — Anesthesia Procedure Notes (Signed)
Arterial Line Insertion Start/End1/04/2020 7:00 AM Performed by: Trinna Post., CRNA, CRNA  Patient location: OOR procedure area. Preanesthetic checklist: patient identified, IV checked, site marked, risks and benefits discussed, surgical consent, monitors and equipment checked, pre-op evaluation, timeout performed and anesthesia consent Patient sedated Left, radial was placed Catheter size: 20 G Hand hygiene performed  and maximum sterile barriers used   Attempts: 1 Procedure performed without using ultrasound guided technique. Following insertion, dressing applied and Biopatch. Post procedure assessment: normal  Patient tolerated the procedure well with no immediate complications.

## 2019-10-14 NOTE — Anesthesia Preprocedure Evaluation (Addendum)
Anesthesia Evaluation  Patient identified by MRN, date of birth, ID band Patient awake and Patient unresponsive    Reviewed: Allergy & Precautions, NPO status , Patient's Chart, lab work & pertinent test resultsPreop documentation limited or incomplete due to emergent nature of procedure.  Airway Mallampati: II  TM Distance: >3 FB     Dental   Pulmonary    breath sounds clear to auscultation       Cardiovascular  Rhythm:Regular Rate:Normal     Neuro/Psych    GI/Hepatic negative GI ROS, Neg liver ROS,   Endo/Other    Renal/GU negative Renal ROS     Musculoskeletal   Abdominal   Peds  Hematology   Anesthesia Other Findings   Reproductive/Obstetrics                            Anesthesia Physical Anesthesia Plan  ASA: III and emergent  Anesthesia Plan: General   Post-op Pain Management:    Induction: Intravenous  PONV Risk Score and Plan: 2 and Ondansetron and Treatment may vary due to age or medical condition  Airway Management Planned: Oral ETT  Additional Equipment:   Intra-op Plan:   Post-operative Plan: Possible Post-op intubation/ventilation  Informed Consent:   Plan Discussed with: Anesthesiologist and CRNA  Anesthesia Plan Comments:        Anesthesia Quick Evaluation

## 2019-10-14 NOTE — Progress Notes (Signed)
Pt was transported to and from MRI via ventilator with no complications noted. Pt was suctioned prior to transport and is now back in room 4N31. RT will continue to monitor.

## 2019-10-14 NOTE — ED Notes (Signed)
One unsuccessful attempt to give report to 4n

## 2019-10-14 NOTE — Progress Notes (Addendum)
Patient ID: Larry Beltran, male   DOB: [birth year], 86 y.o.   MRN: [id number] INR.  64 Y RT H M MRSS of  1 to 2 LSW at 10 pm LN. New onset of Lt sided weakness and dysarthria. CT brain NO ICH ASPECTs 10 CTA occluded basilar arterry. CTPs unremarkable. Endovascular treatment D/W spouse in a 3 way call with Dr Leonel Ramsay. Procedure,reasons,riska alternatives reviewed. Risks of ICH of 10 %,death worsening neuro function and inability to revascularize discussed.Spouse expressed understanding and consented to the treatment. S.Dail Meece MD Post treatment CT brain no evidence of ICH or mass effect or vent prominence. Patiennt itubated per anesthesia for airway protection.RT groin and RT rad approach intact. Distal  pulses in both feet doplerable. S.Taylon Louison MD

## 2019-10-14 NOTE — Progress Notes (Signed)
Report given to 4N RN.

## 2019-10-14 NOTE — H&P (Signed)
Neurology H&P  CC: Left-sided weakness  History is obtained from: Wife  HPI: Larry Beltran is a 86 y.o. male with a history of esophageal cancer who presents with left-sided weakness.  He was in his normal state of health at 10 PM when he went to bed.  Per his wife, he attends to all of his daily activities of living.  Around 2 AM, he awoke and fell out of bed.  His wife heard him and when she went to attend to him she called 911.  Code stroke was activated en route.  He was not candidate for IV TPA due to being outside the window, but a CT A/P revealed basilar occlusion.  CT perfusion suggestive of a potential salvageable area of ischemia(though not as reliable in the posterior circulation) and given the high mortality of untreated vascular thrombosis, the option of intervention was discussed with the family.  His wife elected to proceed.   LKW: 10 PM tpa given?: No, outside window IR Thrombectomy?  Yes Modified Rankin Scale: 1-No significant post stroke disability and can perform usual duties with stroke symptoms   ROS:  Unable to obtain due to altered mental status.   Past medical history: Esophageal cancer T2 N0 M0 adenocarcinoma-currently undergoing chemotherapy with FOLFOX and Herceptin  Family history: Unable to obtain due to altered mental status.  Social History: Unable to obtain due to altered mental status.   Home medications: ferrous gluconate 324 mg daily Protonix 40 mg daily Carafate 1 g daily   Exam: Current vital signs: There were no vitals taken for this visit.   Physical Exam  Constitutional: Appears well-developed and well-nourished.  Psych: Affect appropriate to situation Eyes: No scleral injection HENT: No OP obstrucion Head: Normocephalic.  Cardiovascular: Normal rate and regular rhythm.  Respiratory: Effort normal and breath sounds normal to anterior ascultation GI: Soft.  No distension. There is no tenderness.  Skin: WDI  Neuro: Mental  Status: Patient is awake, alert, he is not able to speak but this appears to be more of a bulbar dysfunction, as he is able to readily communicate via hand gestures and nods/shakes. Cranial Nerves: II: He endorses seeing fingers wiggling in all 4 visual fields. Pupils are equal, round, and reactive to light.   III,IV, VI: EOMI without ptosis or diploplia.  V,VII: Facial movement with left weakness X: Significant dysarthria Motor: He has increased tone in his left arm and triple flexion of the left leg, though I do see voluntary movement of the left leg at least once, he does not do so to command. 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  But he does have esophageal cancer and is currently undergoing treatment, and given his relatively good functional status prior to this event, I do not think it should be an exclusion for treatment.  We discussed with his wife the risks and benefits of endovascular therapy and she has agreed to proceed.  Plan: -Endovascular revascularization - HgbA1c, fasting lipid panel - MRI  of the brain without contrast - Frequent neuro checks - Echocardiogram - Prophylactic therapy-likely antiplatelets, depending on outcome of procedure. - Risk  factor modification - Telemetry monitoring - PT consult, OT consult, Speech consult - Stroke team to follow    This patient is critically ill and at significant risk of neurological worsening, death and care requires constant monitoring of vital signs, hemodynamics,respiratory and cardiac monitoring,  neurological assessment, discussion with family, other specialists and medical decision making of high complexity. I spent 65 minutes of neurocritical care time  in the care of  this patient. This was time spent independent of any time provided by nurse practitioner or PA.  Roland Rack, MD [facility] [phone]  If 7pm- 7am, please page neurology on call as listed in [place].

## 2019-10-14 NOTE — ED Notes (Signed)
Pt makes direct eye contact but unable to answer some questions asked unable to move his lt arm but he attempts to take his rt arm and move his left arm  He makes attempts to move his lt leg ans does so minimallt.  He has a porta cath rt upper chest

## 2019-10-14 NOTE — Progress Notes (Signed)
  Echocardiogram 2D Echocardiogram has been performed.  Larry Beltran 10/14/2019, 1:37 PM

## 2019-10-14 NOTE — Procedures (Signed)
S/P revascularization of occluded prox basilar artery with intracranial stentin for underlying severe ICAD. RT radial approach. Bilateral CCA and bilateral VERT artery angiograms. S.Melinna Linarez MD

## 2019-10-14 NOTE — ED Provider Notes (Addendum)
[facility] EMERGENCY DEPARTMENT Provider Note   CSN: [id number] Arrival date & time: 10/14/19  0500  An emergency department physician performed an initial assessment on this suspected stroke patient at 0500.  History Chief Complaint  Patient presents with  . Code Stroke    LEVEL 5 CAVEAT 2/2 ACUITY OF CONDITION AND NONVERBAL   Roey Virola is a 86 y.o. male.  86 year old male with hx of esophageal adenocarcinoma (dx 2019, s/p chemo and radiation), GIB, hemorrhoids presents to the emergency department as a code stroke.  Found on the floor by family.  LKW at 2200 tonight.  EMS noted facial drooping as well as inability to move the left side.  He is not reportedly on any anticoagulation. Ambulates with a cane at baseline.  Family present on scene, but reported to be poor historians per EMS.  The history is provided by the EMS personnel. No language interpreter was used.       No past medical history on file.  Patient Active Problem List   Diagnosis Date Noted  . Stroke (cerebrum) ([place]) 10/14/2019    ** The histories are not reviewed yet. Please review them in the "History" navigator section and refresh this [place].     No family history on file.  Social History   Tobacco Use  . Smoking status: Not on file  Substance Use Topics  . Alcohol use: Not on file  . Drug use: Not on file    Home Medications Prior to Admission medications   Not on File    Allergies    Patient has no allergy information on record.  Review of Systems   Review of Systems  Unable to perform ROS: Patient nonverbal    Physical Exam Updated Vital Signs There were no vitals taken for this visit.  Physical Exam Vitals and nursing note reviewed.  Constitutional:      Appearance: He is well-developed.     Comments: Chronically thin/frail appearing  HENT:     Head: Normocephalic and atraumatic.  Eyes:     General: No scleral icterus.    Conjunctiva/sclera:  Conjunctivae normal.  Neck:     Comments: C collar in place Pulmonary:     Effort: Pulmonary effort is normal. No respiratory distress.     Comments: Respirations even and unlabored Abdominal:     General: There is no distension.  Musculoskeletal:        General: Normal range of motion.  Skin:    General: Skin is warm and dry.     Coloration: Skin is not pale.     Findings: No erythema or rash.  Neurological:     Mental Status: He is alert.     Cranial Nerves: Cranial nerve deficit present.     Comments: Alert with dysarthria. Left sided facial drooping with inability to move LUE and LLE.   Psychiatric:        Behavior: Behavior normal.     ED Results / Procedures / Treatments   Labs (all labs ordered are listed, but only abnormal results are displayed) Labs Reviewed  CBC - Abnormal; Notable for the following components:      Result Value   RBC 4.01 (*)    Hemoglobin 11.9 (*)    HCT 35.5 (*)    All other components within normal limits  DIFFERENTIAL - Abnormal; Notable for the following components:   Lymphs Abs 0.4 (*)    All other components within normal limits  CBG MONITORING, ED -  Abnormal; Notable for the following components:   Glucose-Capillary 125 (*)    All other components within normal limits  CBG MONITORING, ED - Abnormal; Notable for the following components:   Glucose-Capillary 119 (*)    All other components within normal limits  PROTIME-INR  APTT  ETHANOL  COMPREHENSIVE METABOLIC PANEL  RAPID URINE DRUG SCREEN, HOSP PERFORMED  URINALYSIS, ROUTINE W REFLEX MICROSCOPIC  I-STAT CHEM 8, ED    EKG None  Radiology CT Code Stroke CTA Head W/WO contrast  Result Date: 10/14/2019 CLINICAL DATA:  Slurred speech and left-sided weakness EXAM: CT ANGIOGRAPHY HEAD AND NECK CT PERFUSION BRAIN TECHNIQUE: Multidetector CT imaging of the head and neck was performed using the standard protocol during bolus administration of intravenous contrast. Multiplanar CT  image reconstructions and MIPs were obtained to evaluate the vascular anatomy. Carotid stenosis measurements (when applicable) are obtained utilizing NASCET criteria, using the distal internal carotid diameter as the denominator. Multiphase CT imaging of the brain was performed following IV bolus contrast injection. Subsequent parametric perfusion maps were calculated using RAPID software. CONTRAST:  Dose is currently not available COMPARISON:  Noncontrast head CT earlier today FINDINGS: CTA NECK FINDINGS Aortic arch: Atherosclerotic plaque that is extensive. Four vessel branching. Right carotid system: Relatively mild atherosclerotic plaque at the bifurcation without flow limiting stenosis. No ulceration. Left carotid system: Partial retropharyngeal course. Relatively mild atherosclerotic calcification mainly at the bifurcation without flow limiting stenosis or ulceration. Vertebral arteries: Left vertebral artery arises from the arch. Moderate atheromatous type narrowing at the right V1 segment. Both vertebral arteries are patent at the level of the dural penetration. Skeleton: Spinal degeneration and spondylosis. No acute or aggressive finding Other neck: Port in place. Upper chest: Distended esophagus containing semi solid appearing material were covered. Review of the MIP images confirms the above findings CTA HEAD FINDINGS Anterior circulation: Atherosclerotic plaque along the carotid siphons. No branch occlusion, beading, or aneurysm. Posterior circulation: Dominant right vertebral artery. There is high-grade narrowing of the left V4 segment. Although the basilar is likely hypoplastic in the setting of fetal type left PCA, there is superimposed thready flow followed by occlusion with no flow seen at the basilar tip or throughout the large majority of the right PCA. High-grade left P2 segment stenosis. Venous sinuses: Patent Anatomic variants: As above Review of the MIP images confirms the above findings CT  Brain Perfusion Findings: ASPECTS: 10 CBF (<30%) Volume: 76mL Perfusion (Tmax>6.0s) volume: 41mL-at the level of the pons Critical Value/emergent results were called by telephone at the time of interpretation on 10/14/2019 at 5:28 am to [place] , who verbally acknowledged these results. IMPRESSION: 1. Occluded mid to distal basilar and right PCA. There is correlative abnormal perfusion at the level of the upper pons. 2. High-grade left P2 and left V4 segment stenoses. 3. No flow limiting stenosis or large vessel occlusion in the anterior circulation. 4. Distended upper thoracic esophagus likely containing debris. Electronically Signed   By: Monte Fantasia M.D.   On: 10/14/2019 05:32   CT Code Stroke CTA Neck W/WO contrast  Result Date: 10/14/2019 CLINICAL DATA:  Slurred speech and left-sided weakness EXAM: CT ANGIOGRAPHY HEAD AND NECK CT PERFUSION BRAIN TECHNIQUE: Multidetector CT imaging of the head and neck was performed using the standard protocol during bolus administration of intravenous contrast. Multiplanar CT image reconstructions and MIPs were obtained to evaluate the vascular anatomy. Carotid stenosis measurements (when applicable) are obtained utilizing NASCET criteria, using the distal internal carotid diameter as the denominator.  Multiphase CT imaging of the brain was performed following IV bolus contrast injection. Subsequent parametric perfusion maps were calculated using RAPID software. CONTRAST:  Dose is currently not available COMPARISON:  Noncontrast head CT earlier today FINDINGS: CTA NECK FINDINGS Aortic arch: Atherosclerotic plaque that is extensive. Four vessel branching. Right carotid system: Relatively mild atherosclerotic plaque at the bifurcation without flow limiting stenosis. No ulceration. Left carotid system: Partial retropharyngeal course. Relatively mild atherosclerotic calcification mainly at the bifurcation without flow limiting stenosis or ulceration. Vertebral  arteries: Left vertebral artery arises from the arch. Moderate atheromatous type narrowing at the right V1 segment. Both vertebral arteries are patent at the level of the dural penetration. Skeleton: Spinal degeneration and spondylosis. No acute or aggressive finding Other neck: Port in place. Upper chest: Distended esophagus containing semi solid appearing material were covered. Review of the MIP images confirms the above findings CTA HEAD FINDINGS Anterior circulation: Atherosclerotic plaque along the carotid siphons. No branch occlusion, beading, or aneurysm. Posterior circulation: Dominant right vertebral artery. There is high-grade narrowing of the left V4 segment. Although the basilar is likely hypoplastic in the setting of fetal type left PCA, there is superimposed thready flow followed by occlusion with no flow seen at the basilar tip or throughout the large majority of the right PCA. High-grade left P2 segment stenosis. Venous sinuses: Patent Anatomic variants: As above Review of the MIP images confirms the above findings CT Brain Perfusion Findings: ASPECTS: 10 CBF (<30%) Volume: 67mL Perfusion (Tmax>6.0s) volume: 70mL-at the level of the pons Critical Value/emergent results were called by telephone at the time of interpretation on 10/14/2019 at 5:28 am to [place] , who verbally acknowledged these results. IMPRESSION: 1. Occluded mid to distal basilar and right PCA. There is correlative abnormal perfusion at the level of the upper pons. 2. High-grade left P2 and left V4 segment stenoses. 3. No flow limiting stenosis or large vessel occlusion in the anterior circulation. 4. Distended upper thoracic esophagus likely containing debris. Electronically Signed   By: Monte Fantasia M.D.   On: 10/14/2019 05:32   CT Code Stroke Cerebral Perfusion with contrast  Result Date: 10/14/2019 CLINICAL DATA:  Slurred speech and left-sided weakness EXAM: CT ANGIOGRAPHY HEAD AND NECK CT PERFUSION BRAIN  TECHNIQUE: Multidetector CT imaging of the head and neck was performed using the standard protocol during bolus administration of intravenous contrast. Multiplanar CT image reconstructions and MIPs were obtained to evaluate the vascular anatomy. Carotid stenosis measurements (when applicable) are obtained utilizing NASCET criteria, using the distal internal carotid diameter as the denominator. Multiphase CT imaging of the brain was performed following IV bolus contrast injection. Subsequent parametric perfusion maps were calculated using RAPID software. CONTRAST:  Dose is currently not available COMPARISON:  Noncontrast head CT earlier today FINDINGS: CTA NECK FINDINGS Aortic arch: Atherosclerotic plaque that is extensive. Four vessel branching. Right carotid system: Relatively mild atherosclerotic plaque at the bifurcation without flow limiting stenosis. No ulceration. Left carotid system: Partial retropharyngeal course. Relatively mild atherosclerotic calcification mainly at the bifurcation without flow limiting stenosis or ulceration. Vertebral arteries: Left vertebral artery arises from the arch. Moderate atheromatous type narrowing at the right V1 segment. Both vertebral arteries are patent at the level of the dural penetration. Skeleton: Spinal degeneration and spondylosis. No acute or aggressive finding Other neck: Port in place. Upper chest: Distended esophagus containing semi solid appearing material were covered. Review of the MIP images confirms the above findings CTA HEAD FINDINGS Anterior circulation: Atherosclerotic plaque along the carotid  siphons. No branch occlusion, beading, or aneurysm. Posterior circulation: Dominant right vertebral artery. There is high-grade narrowing of the left V4 segment. Although the basilar is likely hypoplastic in the setting of fetal type left PCA, there is superimposed thready flow followed by occlusion with no flow seen at the basilar tip or throughout the large  majority of the right PCA. High-grade left P2 segment stenosis. Venous sinuses: Patent Anatomic variants: As above Review of the MIP images confirms the above findings CT Brain Perfusion Findings: ASPECTS: 10 CBF (<30%) Volume: 47mL Perfusion (Tmax>6.0s) volume: 22mL-at the level of the pons Critical Value/emergent results were called by telephone at the time of interpretation on 10/14/2019 at 5:28 am to [place] , who verbally acknowledged these results. IMPRESSION: 1. Occluded mid to distal basilar and right PCA. There is correlative abnormal perfusion at the level of the upper pons. 2. High-grade left P2 and left V4 segment stenoses. 3. No flow limiting stenosis or large vessel occlusion in the anterior circulation. 4. Distended upper thoracic esophagus likely containing debris. Electronically Signed   By: Monte Fantasia M.D.   On: 10/14/2019 05:32   CT HEAD CODE STROKE WO CONTRAST  Result Date: 10/14/2019 CLINICAL DATA:  Code stroke.  Slurred speech and left-sided weakness EXAM: CT HEAD WITHOUT CONTRAST TECHNIQUE: Contiguous axial images were obtained from the base of the skull through the vertex without intravenous contrast. COMPARISON:  None. FINDINGS: Brain: No evidence of acute infarction, hemorrhage, hydrocephalus, extra-axial collection or mass lesion/mass effect. Mild chronic small vessel ischemia and volume loss for age. Vascular: No hyperdense vessel or unexpected calcification. Skull: Normal. Negative for fracture or focal lesion. Sinuses/Orbits: Negative Other: These results were communicated to Dr. Leonel Ramsay at 5:12 amon 1/7/2021by text page via the [facility] messaging system. ASPECTS [facility] Stroke Program Early CT Score) - Ganglionic level infarction (caudate, lentiform nuclei, internal capsule, insula, M1-M3 cortex): 7 - Supraganglionic infarction (M4-M6 cortex): 3 Total score (0-10 with 10 being normal): 10 IMPRESSION: No acute finding.  ASPECTS is 10. Electronically Signed    By: Monte Fantasia M.D.   On: 10/14/2019 05:13    Procedures .Critical Care Performed by: Antonietta Breach, PA-C Authorized by: Antonietta Breach, PA-C   Critical care provider statement:    Critical care time (minutes):  45   Critical care was necessary to treat or prevent imminent or life-threatening deterioration of the following conditions:  CNS failure or compromise   Critical care was time spent personally by me on the following activities:  Discussions with consultants, evaluation of patient's response to treatment, examination of patient, ordering and performing treatments and interventions, ordering and review of laboratory studies, ordering and review of radiographic studies, pulse oximetry, re-evaluation of patient's condition, obtaining history from patient or surrogate and review of old charts   (including critical care time)  Medications Ordered in ED Medications   stroke: mapping our early stages of recovery book (has no administration in time range)  0.9 %  sodium chloride infusion (has no administration in time range)  acetaminophen (TYLENOL) tablet 650 mg (has no administration in time range)    Or  acetaminophen (TYLENOL) 160 MG/5ML solution 650 mg (has no administration in time range)    Or  acetaminophen (TYLENOL) suppository 650 mg (has no administration in time range)  senna-docusate (Senokot-S) tablet 1 tablet (has no administration in time range)  aspirin suppository 300 mg (has no administration in time range)    Or  aspirin tablet 325 mg (has no administration in  time range)  iohexol (OMNIPAQUE) 350 MG/ML injection 100 mL (100 mLs Intravenous Contrast Given 10/14/19 0524)    ED Course  I have reviewed the triage vital signs and the nursing notes.  Pertinent labs & imaging results that were available during my care of the patient were reviewed by me and considered in my medical decision making (see chart for details).     MDM Rules/Calculators/A&P                        5:00 AM 86 y/o male with hx of esophageal cancer presenting from home with LKW at [place]. Obvious left sided deficits on arrival by EMS. CODE STROKE called by EMS in the field. Not chronically anticoagulated. Patient stable for transport to CT. Neurohospitalist present on patient arrival.  5:14 AM No ICH on CT noncontrast head. Plan for CTA and perfusion.  5:48 AM C-collar removed by MD [place]. No TTP to the cervical midline once collar removed.  CTA shows occluded mid to distal basilar and right PCA. There is correlative abnormal perfusion at the level of the upper pons. Plan for IR intervention. Neurology to admit.   Final Clinical Impression(s) / ED Diagnoses Final diagnoses:  Acute ischemic stroke [facility])    Rx / DC Orders ED Discharge Orders    None       Antonietta Breach, PA-C 10/14/19 0555    Antonietta Breach, PA-C 10/14/19 [id number]    Veryl Speak, MD 10/14/19 [phone]

## 2019-10-14 NOTE — Progress Notes (Addendum)
Patient arrived to 4NICU around 1115 intubated and sedated.  Right radial and right groin site assess with IR.  Patient's blood pressure very labile, see MAR for med titration.  Sedation medication turn off. Currently patient not responding to pain.  Neurology and Critical care team at bedside to assess patient.  Per neurology MRI to be completed before plans for extubation.  RN to continue to monitor.   1630:  Stroke MD notified that MRI completed.  MD aware of patient's neuro exam.  RN to continue to monitor.

## 2019-10-14 NOTE — Consult Note (Addendum)
PULMONARY / CRITICAL CARE MEDICINE   NAME:  Larry Beltran, MRN:  [id number], DOB:  Dec 22, 1931, LOS: 0 ADMISSION DATE:  10/14/2019, CONSULTATION DATE: 10/14/2019 REFERRING MD: Interventional radiology, CHIEF COMPLAINT:  ams  BRIEF HISTORY:    86 year old with altered mental status with a basilar artery status post HISTORY OF PRESENT ILLNESS   Is an 86 year old male without past medical history significant for to be esophageal cancer adenocarcinoma for which he has been undergoing chemo radiation therapy at [facility].  Last radiation was in November 2020.  He is currently on chemo with FOLFOX he was in his usual state of health until late evening 10/12/2018 when his wife heard him hit the floor and noted left-sided weakness.  He was transferred to [facility] underwent revascularization of occluded basilar artery with stent placement.  He remains on full mechanical ventilatory support in time of this dictation pulmonary critical care has been asked to manage ventilator.  Currently he is hemodynamically stable with blood pressure cuff pressure 120-161 SIGNIFICANT PAST MEDICAL HISTORY   Esophageal cancer Hypertension  SIGNIFICANT EVENTS:  10/14/2019 revascularization of occluded STUDIES:   CT head with occluded basilar artery CULTURES:    ANTIBIOTICS:  None  LINES/TUBES:  10/14/2019 endotracheal>> 10/14/2019 right radial A-line>> Unknown date right Port-A-Cath in place>>  CONSULTANTS:  10/14/2019 pulmonary critical care SUBJECTIVE:  86 year old male sedated on full mechanical ventilatory support.  CONSTITUTIONAL: BP (!) 164/79   Pulse (!) 122   Temp (!) 97.5 F (36.4 C)   Resp 18   Ht 5\' 6"  (1.676 m)   Wt 59 kg   SpO2 96%   BMI 20.98 kg/m   No intake/output data recorded.     Vent Mode: PRVC FiO2 (%):  [40 %] 40 % Set Rate:  [14 bmp] 14 bmp Vt Set:  [510 mL] 510 mL PEEP:  [5 cmH20] 5 cmH20 Plateau Pressure:  [14 cmH20] 14 cmH20  PHYSICAL EXAM: General:  Well-nourished well-developed male currently on full mechanical ventilatory support and sedation with propofol Neuro: Currently heavily sedated with propofol. HEENT: Pupils are equal reactive at 3 mm, endotracheal tube is in place gastric tube is in place Cardiovascular: Heart sounds are regular regular rate rhythm sinus rhythm 61 with a blood pressure of 186/70 venoarterial Lungs: Diminished breath sounds in the bases right upper chest Port-A-Cath noted with Band-Aid in place Abdomen: Soft nontender faint bowel sounds Musculoskeletal: Grossly intact no focal defect Skin:  Warm and dry  RESOLVED PROBLEM LIST   ASSESSMENT AND PLAN   Vent dependent respiratory failure secondary to interventional radiology opening occluded basilar artery Wean per protocol once neurology has designated he has a weaning candidate Wean FiO2 for sats greater than 92% Stat portable chest x-ray Arterial blood gases Ventilator adjustments as needed  Status post revascularization of occluded proximal basilar artery with intracranial stenting for underlying arterial disease.  Left-sided weakness prior to procedure. Per neurology   Esophageal cancer with radiation and chemotherapy at [facility] last radiation therapy on 08/2019 and currently receiving FOLFOX. 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  Remains on full mechanical ventilatory support pulmonary critical care called to the bedside for vent management.  Best Practice /  Goals of Care / Disposition.   DVT PROPHYLAXIS: Sequential hose SUP: PPI NUTRITION: N.p.o. MOBILITY: Bedrest GOALS OF CARE: Full  code FAMILY DISCUSSIONS: Family updated by neurology DISPOSITION currently in neuro intensive care unit on full mechanical ventilatory support  LABS  Glucose Recent Labs  Lab 10/14/19 0503 10/14/19 0547  GLUCAP 125* 119*    BMET Recent Labs  Lab 10/14/19 0503  NA 139  K 4.6  CL 103  CO2 20*  BUN 25*  CREATININE 0.99  GLUCOSE 129*    Liver Enzymes Recent Labs  Lab 10/14/19 0503  AST 37  ALT 33  ALKPHOS 88  BILITOT 0.6  ALBUMIN 4.0    Electrolytes Recent Labs  Lab 10/14/19 0503  CALCIUM 9.4    CBC Recent Labs  Lab 10/14/19 0503  WBC 7.7  HGB 11.9*  HCT 35.5*  PLT 173    ABG No results for input(s): PHART, PCO2ART, PO2ART in the last 168 hours.  Coag's Recent Labs  Lab 10/14/19 0503  APTT 25  INR 1.1    Sepsis Markers No results for input(s): LATICACIDVEN, PROCALCITON, O2SATVEN in the last 168 hours.  Cardiac Enzymes No results for input(s): TROPONINI, PROBNP in the last 168 hours.  PAST MEDICAL HISTORY :   He  has no past medical history on file.  PAST SURGICAL HISTORY:  He  has no past surgical history on file.  No Known Allergies  No current facility-administered medications on file prior to encounter.   No current outpatient medications on file prior to encounter.    FAMILY HISTORY:   His family history is not on file.  SOCIAL HISTORY:  He  reports that he has never smoked. He has never used smokeless tobacco. He reports that he does not drink alcohol.  REVIEW OF SYSTEMS:    Na    App cct 45 min   Richardson Landry Minor ACNP Acute Care Nurse Practitioner [place] Please consult [place] 10/14/2019, 11:39 AM  Attending Note:  86 year old with extensive PMH who presents to PCCM for vent management after being intubated for basilar artery clot retrieval in IR.  No events since being in the ICU.  On exam, he is unresponsive but has a respiratory drive with clear lungs.  I reviewed CXR myself, ETT is in a good  position.  Discussed with PCCM-NP.  Will maintain on the vent.  Order ABG and adjust vent accordingly.  Titrate O2 for sat of 88-92%.  Holding sedation for now.  BP control per neuro.  If still intubated in AM will start TF.  Hold weaning for now.  The patient is critically ill with multiple organ systems failure and requires high complexity decision making for assessment and support, frequent evaluation and titration of therapies, application of advanced monitoring technologies and extensive interpretation of multiple databases.   Critical Care Time devoted to patient care services described in this note is  33  Minutes. This time reflects time of care of this signee Dr Jennet Maduro. This critical care time does not reflect procedure time, or teaching time or supervisory time of PA/NP/Med student/Med Resident etc but could involve care discussion time.  Rush Farmer, M.D. [facility] Pulmonary/Critical Care Medicine.

## 2019-10-14 NOTE — Anesthesia Procedure Notes (Signed)
Procedure Name: Intubation Date/Time: 10/14/2019 6:47 AM Performed by: Clovis Cao, CRNA Pre-anesthesia Checklist: Patient identified, Emergency Drugs available, Suction available, Patient being monitored and Timeout performed Patient Re-evaluated:Patient Re-evaluated prior to induction Oxygen Delivery Method: Circle system utilized Preoxygenation: Pre-oxygenation with 100% oxygen Induction Type: IV induction, Rapid sequence and Cricoid Pressure applied Laryngoscope Size: Glidescope Grade View: Grade I Tube type: Oral Tube size: 8.0 mm Number of attempts: 1 Airway Equipment and Method: Stylet and Video-laryngoscopy Placement Confirmation: ETT inserted through vocal cords under direct vision,  positive ETCO2,  breath sounds checked- equal and bilateral and CO2 detector Secured at: 22 cm Tube secured with: Tape Dental Injury: Teeth and Oropharynx as per pre-operative assessment

## 2019-10-15 DIAGNOSIS — E43 Unspecified severe protein-calorie malnutrition: Secondary | ICD-10-CM | POA: Diagnosis present

## 2019-10-15 DIAGNOSIS — E876 Hypokalemia: Secondary | ICD-10-CM

## 2019-10-15 LAB — CBC WITH DIFFERENTIAL/PLATELET
Abs Immature Granulocytes: 0.03 10*3/uL (ref 0.00–0.07)
Basophils Absolute: 0 10*3/uL (ref 0.0–0.1)
Basophils Relative: 0 %
Eosinophils Absolute: 0.1 10*3/uL (ref 0.0–0.5)
Eosinophils Relative: 2 %
HCT: 25.9 % — ABNORMAL LOW (ref 39.0–52.0)
Hemoglobin: 8.2 g/dL — ABNORMAL LOW (ref 13.0–17.0)
Immature Granulocytes: 1 %
Lymphocytes Relative: 3 %
Lymphs Abs: 0.2 10*3/uL — ABNORMAL LOW (ref 0.7–4.0)
MCH: 28.8 pg (ref 26.0–34.0)
MCHC: 31.7 g/dL (ref 30.0–36.0)
MCV: 90.9 fL (ref 80.0–100.0)
Monocytes Absolute: 0.8 10*3/uL (ref 0.1–1.0)
Monocytes Relative: 12 %
Neutro Abs: 5.4 10*3/uL (ref 1.7–7.7)
Neutrophils Relative %: 82 %
Platelets: 140 10*3/uL — ABNORMAL LOW (ref 150–400)
RBC: 2.85 MIL/uL — ABNORMAL LOW (ref 4.22–5.81)
RDW: 15.5 % (ref 11.5–15.5)
WBC: 6.6 10*3/uL (ref 4.0–10.5)
nRBC: 0.3 % — ABNORMAL HIGH (ref 0.0–0.2)

## 2019-10-15 LAB — BASIC METABOLIC PANEL
Anion gap: 10 (ref 5–15)
BUN: 16 mg/dL (ref 8–23)
CO2: 18 mmol/L — ABNORMAL LOW (ref 22–32)
Calcium: 8 mg/dL — ABNORMAL LOW (ref 8.9–10.3)
Chloride: 105 mmol/L (ref 98–111)
Creatinine, Ser: 0.95 mg/dL (ref 0.61–1.24)
GFR calc Af Amer: >60 mL/min (ref >60)
GFR calc non Af Amer: >60 mL/min (ref >60)
Glucose, Bld: 105 mg/dL — ABNORMAL HIGH (ref 70–99)
Potassium: 3.4 mmol/L — ABNORMAL LOW (ref 3.5–5.1)
Sodium: 133 mmol/L — ABNORMAL LOW (ref 135–145)

## 2019-10-15 LAB — LIPID PANEL
Cholesterol: 198 mg/dL (ref 0–200)
HDL: 27 mg/dL — ABNORMAL LOW (ref >40)
LDL Cholesterol: 129 mg/dL — ABNORMAL HIGH (ref 0–99)
Total CHOL/HDL Ratio: 7.3 RATIO
Triglycerides: 209 mg/dL — ABNORMAL HIGH (ref <150)
VLDL: 42 mg/dL — ABNORMAL HIGH (ref 0–40)

## 2019-10-15 LAB — HEMOGLOBIN A1C
Hgb A1c MFr Bld: 6.6 % — ABNORMAL HIGH (ref 4.8–5.6)
Mean Plasma Glucose: 142.72 mg/dL

## 2019-10-15 LAB — MAGNESIUM: Magnesium: 2 mg/dL (ref 1.7–2.4)

## 2019-10-15 LAB — POCT I-STAT, CHEM 8
BUN: 22 mg/dL (ref 8–23)
Calcium, Ion: 1.2 mmol/L (ref 1.15–1.40)
Chloride: 104 mmol/L (ref 98–111)
Creatinine, Ser: 0.8 mg/dL (ref 0.61–1.24)
Glucose, Bld: 117 mg/dL — ABNORMAL HIGH (ref 70–99)
HCT: 35 % — ABNORMAL LOW (ref 39.0–52.0)
Hemoglobin: 11.9 g/dL — ABNORMAL LOW (ref 13.0–17.0)
Potassium: 4.2 mmol/L (ref 3.5–5.1)
Sodium: 137 mmol/L (ref 135–145)
TCO2: 23 mmol/L (ref 22–32)

## 2019-10-15 LAB — PHOSPHORUS: Phosphorus: 3.1 mg/dL (ref 2.5–4.6)

## 2019-10-15 LAB — TRIGLYCERIDES: Triglycerides: 214 mg/dL — ABNORMAL HIGH (ref <150)

## 2019-10-15 LAB — POCT ACTIVATED CLOTTING TIME: Activated Clotting Time: 224 seconds

## 2019-10-15 LAB — PLATELET INHIBITION P2Y12: Platelet Function  P2Y12: 126 [PRU] — ABNORMAL LOW (ref 182–335)

## 2019-10-15 MED ORDER — MORPHINE BOLUS VIA INFUSION
5.0000 mg | INTRAVENOUS | Status: DC | PRN
  Filled 2019-10-15: qty 5

## 2019-10-15 MED ORDER — GLYCOPYRROLATE 1 MG PO TABS: 1.0000 mg | ORAL_TABLET | ORAL | Status: DC | PRN

## 2019-10-15 MED ORDER — TICAGRELOR 90 MG PO TABS: 90.0000 mg | ORAL_TABLET | Freq: Once | ORAL | Status: DC

## 2019-10-15 MED ORDER — VITAL AF 1.2 CAL PO LIQD: 1000.0000 mL | ORAL | Status: DC

## 2019-10-15 MED ORDER — GLYCOPYRROLATE 0.2 MG/ML IJ SOLN: 0.2000 mg | INTRAMUSCULAR | Status: DC | PRN

## 2019-10-15 MED ORDER — POTASSIUM CHLORIDE 20 MEQ/15ML (10%) PO SOLN
40.0000 meq | Freq: Three times a day (TID) | ORAL | Status: DC
  Administered 2019-10-15: 11:00:00 40 meq
  Filled 2019-10-15: qty 30

## 2019-10-15 MED ORDER — PROPOFOL 1000 MG/100ML IV EMUL
5.0000 ug/kg/min | INTRAVENOUS | Status: DC
  Administered 2019-10-15 – 2019-10-16 (×2): 10 ug/kg/min via INTRAVENOUS
  Administered 2019-10-17: 5 ug/kg/min via INTRAVENOUS
  Administered 2019-10-17: 20:00:00 10 ug/kg/min via INTRAVENOUS
  Filled 2019-10-15 (×2): qty 100

## 2019-10-15 MED ORDER — DIPHENHYDRAMINE HCL 50 MG/ML IJ SOLN: 25.0000 mg | INTRAMUSCULAR | Status: DC | PRN

## 2019-10-15 MED ORDER — MORPHINE SULFATE (PF) 2 MG/ML IV SOLN: 2.0000 mg | INTRAVENOUS | Status: DC | PRN

## 2019-10-15 MED ORDER — POLYVINYL ALCOHOL 1.4 % OP SOLN
1.0000 [drp] | Freq: Four times a day (QID) | OPHTHALMIC | Status: DC | PRN
  Filled 2019-10-15: qty 15

## 2019-10-15 MED ORDER — MORPHINE 100MG IN NS 100ML (1MG/ML) PREMIX INFUSION: 0.0000 mg/h | INTRAVENOUS | Status: DC

## 2019-10-15 NOTE — Progress Notes (Signed)
STROKE TEAM PROGRESS NOTE   INTERVAL HISTORY   MRI scan shows a fairly large pontine infarct and patient is awake though quadriplegic.  Overnight heremained neurologically stable.  He is off sedation and being weaned off ventilatory support.  Vital signs are stable.  Vitals:   10/15/19 1000 10/15/19 1100 10/15/19 1123 10/15/19 1200  BP: (!) 113/53 (!) 116/50 (!) 116/50 (!) 105/47  Pulse: 86 84 89 89  Resp: '12 15 15 15  ' Temp:    98.8 F (37.1 C)  TempSrc:    Axillary  SpO2: 100% 100% 100% 100%  Weight:      Height:        CBC:  Recent Labs  Lab 10/14/19 0503 10/14/19 1230 10/15/19 0435  WBC 7.7  --  6.6  NEUTROABS 6.6  --  5.4  HGB 11.9* 9.5* 8.2*  HCT 35.5* 28.0* 25.9*  MCV 88.5  --  90.9  PLT 173  --  140*    Basic Metabolic Panel:  Recent Labs  Lab 10/14/19 0503 10/14/19 0642 10/14/19 1230 10/15/19 0435  NA 139 137 139 133*  K 4.6 4.2 3.8 3.4*  CL 103 104  --  105  CO2 20*  --   --  18*  GLUCOSE 129* 117*  --  105*  BUN 25* 22  --  16  CREATININE 0.99 0.80  --  0.95  CALCIUM 9.4  --   --  8.0*  MG  --   --   --  2.0  PHOS  --   --   --  3.1   Lipid Panel:     Component Value Date/Time   CHOL 198 10/15/2019 0435   TRIG 214 (H) 10/15/2019 0435   TRIG 209 (H) 10/15/2019 0435   HDL 27 (L) 10/15/2019 0435   CHOLHDL 7.3 10/15/2019 0435   VLDL 42 (H) 10/15/2019 0435   LDLCALC 129 (H) 10/15/2019 0435   HgbA1c:  Lab Results  Component Value Date   HGBA1C 6.6 (H) 10/15/2019   Urine Drug Screen: No results found for: LABOPIA, COCAINSCRNUR, LABBENZ, AMPHETMU, THCU, LABBARB  Alcohol Level     Component Value Date/Time   ETH <10 10/14/2019 0503    IMAGING past 48 hours CT Code Stroke CTA Head W/WO contrast  Result Date: 10/14/2019 CLINICAL DATA:  Slurred speech and left-sided weakness EXAM: CT ANGIOGRAPHY HEAD AND NECK CT PERFUSION BRAIN TECHNIQUE: Multidetector CT imaging of the head and neck was performed using the standard protocol during bolus  administration of intravenous contrast. Multiplanar CT image reconstructions and MIPs were obtained to evaluate the vascular anatomy. Carotid stenosis measurements (when applicable) are obtained utilizing NASCET criteria, using the distal internal carotid diameter as the denominator. Multiphase CT imaging of the brain was performed following IV bolus contrast injection. Subsequent parametric perfusion maps were calculated using RAPID software. CONTRAST:  Dose is currently not available COMPARISON:  Noncontrast head CT earlier today FINDINGS: CTA NECK FINDINGS Aortic arch: Atherosclerotic plaque that is extensive. Four vessel branching. Right carotid system: Relatively mild atherosclerotic plaque at the bifurcation without flow limiting stenosis. No ulceration. Left carotid system: Partial retropharyngeal course. Relatively mild atherosclerotic calcification mainly at the bifurcation without flow limiting stenosis or ulceration. Vertebral arteries: Left vertebral artery arises from the arch. Moderate atheromatous type narrowing at the right V1 segment. Both vertebral arteries are patent at the level of the dural penetration. Skeleton: Spinal degeneration and spondylosis. No acute or aggressive finding Other neck: Port in place. Upper chest: Distended  esophagus containing semi solid appearing material were covered. Review of the MIP images confirms the above findings CTA HEAD FINDINGS Anterior circulation: Atherosclerotic plaque along the carotid siphons. No branch occlusion, beading, or aneurysm. Posterior circulation: Dominant right vertebral artery. There is high-grade narrowing of the left V4 segment. Although the basilar is likely hypoplastic in the setting of fetal type left PCA, there is superimposed thready flow followed by occlusion with no flow seen at the basilar tip or throughout the large majority of the right PCA. High-grade left P2 segment stenosis. Venous sinuses: Patent Anatomic variants: As above  Review of the MIP images confirms the above findings CT Brain Perfusion Findings: ASPECTS: 10 CBF (<30%) Volume: 56m Perfusion (Tmax>6.0s) volume: 475mat the level of the pons Critical Value/emergent results were called by telephone at the time of interpretation on 10/14/2019 at 5:28 am to prSan Elizario who verbally acknowledged these results. IMPRESSION: 1. Occluded mid to distal basilar and right PCA. There is correlative abnormal perfusion at the level of the upper pons. 2. High-grade left P2 and left V4 segment stenoses. 3. No flow limiting stenosis or large vessel occlusion in the anterior circulation. 4. Distended upper thoracic esophagus likely containing debris. Electronically Signed   By: JoMonte Fantasia.D.   On: 10/14/2019 05:32   CT Code Stroke CTA Neck W/WO contrast  Result Date: 10/14/2019 CLINICAL DATA:  Slurred speech and left-sided weakness EXAM: CT ANGIOGRAPHY HEAD AND NECK CT PERFUSION BRAIN TECHNIQUE: Multidetector CT imaging of the head and neck was performed using the standard protocol during bolus administration of intravenous contrast. Multiplanar CT image reconstructions and MIPs were obtained to evaluate the vascular anatomy. Carotid stenosis measurements (when applicable) are obtained utilizing NASCET criteria, using the distal internal carotid diameter as the denominator. Multiphase CT imaging of the brain was performed following IV bolus contrast injection. Subsequent parametric perfusion maps were calculated using RAPID software. CONTRAST:  Dose is currently not available COMPARISON:  Noncontrast head CT earlier today FINDINGS: CTA NECK FINDINGS Aortic arch: Atherosclerotic plaque that is extensive. Four vessel branching. Right carotid system: Relatively mild atherosclerotic plaque at the bifurcation without flow limiting stenosis. No ulceration. Left carotid system: Partial retropharyngeal course. Relatively mild atherosclerotic calcification mainly at the bifurcation  without flow limiting stenosis or ulceration. Vertebral arteries: Left vertebral artery arises from the arch. Moderate atheromatous type narrowing at the right V1 segment. Both vertebral arteries are patent at the level of the dural penetration. Skeleton: Spinal degeneration and spondylosis. No acute or aggressive finding Other neck: Port in place. Upper chest: Distended esophagus containing semi solid appearing material were covered. Review of the MIP images confirms the above findings CTA HEAD FINDINGS Anterior circulation: Atherosclerotic plaque along the carotid siphons. No branch occlusion, beading, or aneurysm. Posterior circulation: Dominant right vertebral artery. There is high-grade narrowing of the left V4 segment. Although the basilar is likely hypoplastic in the setting of fetal type left PCA, there is superimposed thready flow followed by occlusion with no flow seen at the basilar tip or throughout the large majority of the right PCA. High-grade left P2 segment stenosis. Venous sinuses: Patent Anatomic variants: As above Review of the MIP images confirms the above findings CT Brain Perfusion Findings: ASPECTS: 10 CBF (<30%) Volume: 32m2merfusion (Tmax>6.0s) volume: 4mL87m the level of the pons Critical Value/emergent results were called by telephone at the time of interpretation on 10/14/2019 at 5:28 am to provLewistownho verbally acknowledged these results. IMPRESSION: 1. Occluded mid to distal basilar  and right PCA. There is correlative abnormal perfusion at the level of the upper pons. 2. High-grade left P2 and left V4 segment stenoses. 3. No flow limiting stenosis or large vessel occlusion in the anterior circulation. 4. Distended upper thoracic esophagus likely containing debris. Electronically Signed   By: Monte Fantasia M.D.   On: 10/14/2019 05:32   MR BRAIN WO CONTRAST  Result Date: 10/14/2019 CLINICAL DATA:  Slurred speech and left-sided weakness. Follow-up basilar and right  PCA occlusion EXAM: MRI HEAD WITHOUT CONTRAST TECHNIQUE: Multiplanar, multiecho pulse sequences of the brain and surrounding structures were obtained without intravenous contrast. COMPARISON:  CT studies earlier today FINDINGS: Brain: Diffusion imaging shows a 2 cm region of acute infarction affecting the pons. Scattered small acute infarctions are present within the right cerebellar hemisphere inferior to superior. No acute infarction in the left cerebellar hemisphere. No supratentorial infarction. Specifically, thalami and occipital lobes are intact. No evidence of hemorrhage in the regions of acute infarction. There is an old infarction in the right lateral pons. Cerebral hemispheres show mild chronic small-vessel change of the white matter considering age. No hydrocephalus or extra-axial collection. Vascular: Possible slow flow in the vertebrobasilar system. Skull and upper cervical spine: Negative Sinuses/Orbits: Mucosal thickening of the paranasal sinuses. Fluid in the nasopharynx often seen with intubation. Other: None IMPRESSION: 2 cm acute infarction affecting the central pons. No visible hemorrhage. Scattered small infarctions within the right cerebellum inferior to superior. Old infarction right lateral pons. Mild chronic small-vessel ischemic change of the cerebral hemispheric white matter. Electronically Signed   By: Foell Chimes M.D.   On: 10/14/2019 15:44   CT C-SPINE NO CHARGE  Result Date: 10/14/2019 CLINICAL DATA:  Neurologic deficits, fall EXAM: CT CERVICAL SPINE WITHOUT CONTRAST TECHNIQUE: Multidetector CT imaging of the cervical spine was performed without intravenous contrast. Multiplanar CT image reconstructions were also generated. COMPARISON:  Same day CT angiography of the head neck FINDINGS: Alignment: Slight exaggeration of the normal cervical lordosis. Mild anterolisthesis of C4 on C5 likely on a facet degenerative basis. No abnormally perched, jump to widened facets. Craniocervical  into axial alignment is maintained. Skull base and vertebrae: Degenerative appearing fusion of the left articular facet at C2-3. Likely degenerative fusion of the right articular facets at C5-6 as well. No acute fracture. No primary bone lesion or focal pathologic process. Soft tissues and spinal canal: No pre or paravertebral fluid or swelling. No visible canal hematoma. Disc levels: Multilevel moderate to severe intervertebral disc height loss with vacuum phenomenon at C3-4 and C6-7 as well as multilevel cervical spondylitic endplate changes with advanced uncinate spurring and facet hypertrophic changes well. Findings result in at least moderate canal stenosis at C5-6 and C6-7 as well as multilevel mild-to-moderate neural foraminal narrowing. Degenerative changes are milder in the upper thoracic spine. Upper chest: Secretions noted in the posterior oropharynx. Debris noted in the upper thoracic esophagus. Biapical pleuroparenchymal scarring is present. Other: Thyroid gland is unremarkable. Cervical carotid atherosclerosis better assessed on CT angiography. IMPRESSION: 1. No acute osseous abnormality or traumatic listhesis. 2. Slight exaggeration of the normal cervical lordosis with mild anterolisthesis of C4 on C5 likely on a facet degenerative basis. Degenerative fusion of the left articular facet at C2-3 and right articular facet at C5-6. 3. Multilevel moderate to severe cervical spondylosis with advanced uncinate spurring and facet hypertrophic changes resulting in at least moderate canal stenosis at C5-6 and C6-7 as well as multilevel mild-to-moderate neural foraminal narrowing. 4. Secretions noted in the upper  thoracic esophagus. Correlate for reflux. 5. Cervical carotid atherosclerosis better assessed on CT angiography. Electronically Signed   By: Lovena Le M.D.   On: 10/14/2019 05:57   CT Code Stroke Cerebral Perfusion with contrast  Result Date: 10/14/2019 CLINICAL DATA:  Slurred speech and  left-sided weakness EXAM: CT ANGIOGRAPHY HEAD AND NECK CT PERFUSION BRAIN TECHNIQUE: Multidetector CT imaging of the head and neck was performed using the standard protocol during bolus administration of intravenous contrast. Multiplanar CT image reconstructions and MIPs were obtained to evaluate the vascular anatomy. Carotid stenosis measurements (when applicable) are obtained utilizing NASCET criteria, using the distal internal carotid diameter as the denominator. Multiphase CT imaging of the brain was performed following IV bolus contrast injection. Subsequent parametric perfusion maps were calculated using RAPID software. CONTRAST:  Dose is currently not available COMPARISON:  Noncontrast head CT earlier today FINDINGS: CTA NECK FINDINGS Aortic arch: Atherosclerotic plaque that is extensive. Four vessel branching. Right carotid system: Relatively mild atherosclerotic plaque at the bifurcation without flow limiting stenosis. No ulceration. Left carotid system: Partial retropharyngeal course. Relatively mild atherosclerotic calcification mainly at the bifurcation without flow limiting stenosis or ulceration. Vertebral arteries: Left vertebral artery arises from the arch. Moderate atheromatous type narrowing at the right V1 segment. Both vertebral arteries are patent at the level of the dural penetration. Skeleton: Spinal degeneration and spondylosis. No acute or aggressive finding Other neck: Port in place. Upper chest: Distended esophagus containing semi solid appearing material were covered. Review of the MIP images confirms the above findings CTA HEAD FINDINGS Anterior circulation: Atherosclerotic plaque along the carotid siphons. No branch occlusion, beading, or aneurysm. Posterior circulation: Dominant right vertebral artery. There is high-grade narrowing of the left V4 segment. Although the basilar is likely hypoplastic in the setting of fetal type left PCA, there is superimposed thready flow followed by  occlusion with no flow seen at the basilar tip or throughout the large majority of the right PCA. High-grade left P2 segment stenosis. Venous sinuses: Patent Anatomic variants: As above Review of the MIP images confirms the above findings CT Brain Perfusion Findings: ASPECTS: 10 CBF (<30%) Volume: 54m Perfusion (Tmax>6.0s) volume: 435mat the level of the pons Critical Value/emergent results were called by telephone at the time of interpretation on 10/14/2019 at 5:28 am to prLewis who verbally acknowledged these results. IMPRESSION: 1. Occluded mid to distal basilar and right PCA. There is correlative abnormal perfusion at the level of the upper pons. 2. High-grade left P2 and left V4 segment stenoses. 3. No flow limiting stenosis or large vessel occlusion in the anterior circulation. 4. Distended upper thoracic esophagus likely containing debris. Electronically Signed   By: JoMonte Fantasia.D.   On: 10/14/2019 05:32   DG CHEST PORT 1 VIEW  Result Date: 10/14/2019 CLINICAL DATA:  Post op/ ETT adjusted. EXAM: PORTABLE CHEST 1 VIEW COMPARISON:  Radiograph 10/14/2019 FINDINGS: Endotracheal to 5 cm from carina. Port in the anterior chest wall with tip in distal SVC. NG tube in stomach. Stable cardiac silhouette. Small LEFT effusion. Hyperinflated lungs. No pneumonia. IMPRESSION: 1. No tracheal tube in good position. 2. Small LEFT effusion. 3. Hyperinflated lungs mild basilar atelectasis. Electronically Signed   By: StSuzy Bouchard.D.   On: 10/14/2019 13:23   DG CHEST PORT 1 VIEW  Result Date: 10/14/2019 CLINICAL DATA:  ETT placement EXAM: PORTABLE CHEST 1 VIEW COMPARISON:  None. FINDINGS: Endotracheal tube with the tip 6 cm above carina. NG tube coursing below the diaphragm. Port a  cath with the tip projecting over the SVC. Bilateral mild interstitial thickening. Hazy right lower lobe airspace disease. Trace left pleural effusion. Normal heart size. Opacity in the upper mediastinum and  partially projecting over the right lung apex. Thoracic aortic atherosclerosis. No aggressive osseous lesion. IMPRESSION: 1. Support lines and tubing as described above. 2. Opacity in the upper mediastinum and partially projecting over the right lung apex likely artifactual. 3. Trace left pleural effusion. Hazy right lower lobe airspace disease which may reflect atelectasis versus pneumonia. 4. Electronically Signed   By: Kathreen Devoid   On: 10/14/2019 12:19   ECHOCARDIOGRAM COMPLETE  Result Date: 10/14/2019   ECHOCARDIOGRAM REPORT   Patient Name:   Larry Beltran Date of Exam: 10/14/2019 Medical Rec #:  [id number]    Height:       66.0 in Accession #:    [id number]   Weight:       136.7 lb Date of Birth:  01/15/1932     BSA:          1.70 m Patient Age:    64 years     BP:           131/63 mmHg Patient Gender: M            HR:           91 bpm. Exam Location:  Inpatient Procedure: 2D Echo, Cardiac Doppler and Color Doppler Indications:    CVA  History:        Patient has no prior history of Echocardiogram examinations.                 Risk Factors:Hypertension. Esophageal cancer, chemo, resp.                 failure.  Sonographer:    Dustin Flock Referring Phys: [facility]  1. Left ventricular ejection fraction, by visual estimation, is 65 to 70%. The left ventricle has hyperdynamic function. There is no left ventricular hypertrophy.  2. Left ventricular diastolic parameters are consistent with Grade II diastolic dysfunction (pseudonormalization).  3. The left ventricle has no regional wall motion abnormalities.  4. Global right ventricle has normal systolic function.The right ventricular size is normal. No increase in right ventricular wall thickness.  5. Left atrial size was normal.  6. Right atrial size was normal.  7. The mitral valve is normal in structure. Trivial mitral valve regurgitation.  8. The tricuspid valve is normal in structure.  9. The aortic valve is tricuspid. Aortic  valve regurgitation is not visualized. Mild aortic valve sclerosis without stenosis. 10. The pulmonic valve was not well visualized. Pulmonic valve regurgitation is not visualized. 11. Moderately elevated pulmonary artery systolic pressure. 12. The tricuspid regurgitant velocity is 3.50 m/s, and with an assumed right atrial pressure of 8 mmHg, the estimated right ventricular systolic pressure is moderately elevated at 57.0 mmHg. 13. The inferior vena cava is normal in size with <50% respiratory variability, suggesting right atrial pressure of 8 mmHg. 14. The atrial septum is grossly normal. 15. Cannot exclude ASD/PFO. Consider transesophageal echocardiogram, if clinically indicated. FINDINGS  Left Ventricle: Left ventricular ejection fraction, by visual estimation, is 65 to 70%. The left ventricle has hyperdynamic function. The left ventricle has no regional wall motion abnormalities. There is no left ventricular hypertrophy. Left ventricular diastolic parameters are consistent with Grade II diastolic dysfunction (pseudonormalization). Right Ventricle: The right ventricular size is normal. No increase in right ventricular wall thickness. Global RV systolic  function is has normal systolic function. The tricuspid regurgitant velocity is 3.50 m/s, and with an assumed right atrial pressure  of 8 mmHg, the estimated right ventricular systolic pressure is moderately elevated at 57.0 mmHg. Left Atrium: Left atrial size was normal in size. Right Atrium: Right atrial size was normal in size Pericardium: There is no evidence of pericardial effusion. Mitral Valve: The mitral valve is normal in structure. There is mild thickening of the mitral valve leaflet(s). Trivial mitral valve regurgitation. Tricuspid Valve: The tricuspid valve is normal in structure. Tricuspid valve regurgitation mild-moderate. Aortic Valve: The aortic valve is tricuspid. . There is mild thickening and mild calcification of the aortic valve. Aortic valve  regurgitation is not visualized. Mild aortic valve sclerosis is present, with no evidence of aortic valve stenosis. There is mild thickening of the aortic valve. There is mild calcification of the aortic valve. Pulmonic Valve: The pulmonic valve was not well visualized. Pulmonic valve regurgitation is not visualized. Pulmonic regurgitation is not visualized. Aorta: The aortic root, ascending aorta and aortic arch are all structurally normal, with no evidence of dilitation or obstruction. Pulmonary Artery: The pulmonary artery is not well seen. Venous: The inferior vena cava is normal in size with less than 50% respiratory variability, suggesting right atrial pressure of 8 mmHg. IAS/Shunts: The atrial septum is grossly normal. Additional Comments: Flow jet at the atrial septum was seen, possible venous return; however, could not exclude ASD/PFO. If clinically indicated consider transesophageal echocardiogram.  LEFT VENTRICLE PLAX 2D LVIDd:         3.90 cm  Diastology LVIDs:         1.70 cm  LV e' lateral:   6.74 cm/s LV PW:         1.00 cm  LV E/e' lateral: 16.3 LV IVS:        1.00 cm  LV e' medial:    5.98 cm/s LVOT diam:     2.00 cm  LV E/e' medial:  18.4 LV SV:         58 ml LV SV Index:   33.82 LVOT Area:     3.14 cm  RIGHT VENTRICLE RV Basal diam:  3.20 cm RV S prime:     16.00 cm/s TAPSE (M-mode): 2.6 cm LEFT ATRIUM           Index       RIGHT ATRIUM           Index LA diam:      2.90 cm 1.70 cm/m  RA Area:     13.50 cm LA Vol (A4C): 27.1 ml 15.93 ml/m RA Volume:   33.30 ml  19.58 ml/m  AORTIC VALVE LVOT Vmax:   114.00 cm/s LVOT Vmean:  67.900 cm/s LVOT VTI:    0.215 m  AORTA Ao Root diam: 3.20 cm MITRAL VALVE                         TRICUSPID VALVE MV Area (PHT): 7.37 cm              TR Peak grad:   49.0 mmHg MV PHT:        29.87 msec            TR Vmax:        354.00 cm/s MV Decel Time: 103 msec MV E velocity: 110.00 cm/s 103 cm/s  SHUNTS MV A velocity: 158.00 cm/s 70.3 cm/s Systemic VTI:  0.22 m MV E/A  ratio:  0.70        1.5       Systemic Diam: 2.00 cm  Buford Dresser MD Electronically signed by Buford Dresser MD Signature Date/Time: 10/14/2019/5:46:44 PM    Final    CT HEAD CODE STROKE WO CONTRAST  Result Date: 10/14/2019 CLINICAL DATA:  Code stroke.  Slurred speech and left-sided weakness EXAM: CT HEAD WITHOUT CONTRAST TECHNIQUE: Contiguous axial images were obtained from the base of the skull through the vertex without intravenous contrast. COMPARISON:  None. FINDINGS: Brain: No evidence of acute infarction, hemorrhage, hydrocephalus, extra-axial collection or mass lesion/mass effect. Mild chronic small vessel ischemia and volume loss for age. Vascular: No hyperdense vessel or unexpected calcification. Skull: Normal. Negative for fracture or focal lesion. Sinuses/Orbits: Negative Other: These results were communicated to Dr. Leonel Ramsay at 5:12 amon 1/7/2021by text page via the [facility] messaging system. ASPECTS [facility] Stroke Program Early CT Score) - Ganglionic level infarction (caudate, lentiform nuclei, internal capsule, insula, M1-M3 cortex): 7 - Supraganglionic infarction (M4-M6 cortex): 3 Total score (0-10 with 10 being normal): 10 IMPRESSION: No acute finding.  ASPECTS is 10. Electronically Signed   By: Monte Fantasia M.D.   On: 10/14/2019 05:13   Cerebral Angio 10/14/2019 S/P revascularization of occluded prox basilar artery with intracranial stentin for underlying severe ICAD. RT radial approach. Bilateral CCA and bilateral VERT artery angiograms.  PHYSICAL EXAM    Patient is awake and not on sedation and intubated.  Not in distress. . Afebrile. Head is nontraumatic. Neck is supple without bruit.    Cardiac exam no murmur or gallop. Lungs are clear to auscultation. Distal pulses are well felt. Neurological Exam :  Patient is awake and intubated.  Eyes are open.  He can respond to questions with vertical and horizontal eye movements only.  Pupils are equal reactive.  Doll's eye  movements are sluggish.  Corneal reflexes are sluggish.  He has a weak cough and gag.  He quadriplegic and does not have any voluntary movements in all 4 extremities.  He has trace withdrawal in his lower extremities left more than right.  Deep tendon reflexes are depressed.  Plantars are equivocal.   ASSESSMENT/PLAN Larry Beltran is a 86 y.o. male with history of esophageal cancer presenting with L sided weakness following a fall. Taken to IR for BA occlusion.   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 ASPECTS 10.     CTA head & neck mid to distal BA occlusion and R PCA occlusion. High-grade L P2 and L V4 stenoses.   CT perfusion abnormal level of upper pons  CT CS no trauma noted. Slight exaggeration of cervical lordosis. Fusion L  C2-3 and R C5-6. Moderate to severe cervical spondylosis w/ moderate canal stenosis C5-6 and C6-7.  Cerebral angio proximal BA occlusion s/p stent for intracranial atherosclerosis   Post IR CT no evidence of ICH or mass effect or vent prominence  MRI  central pontine infarct. Scattered R cerebellar infarcts. Old R lateral pontine infarct. Small vessel disease.   2D Echo EF 65-70%. No source of embolus     LDL 129    HgbA1c 6.6    SCDs for VTE prophylaxis  no antithrombotics prior to admission, now on aspirin 81 mg daily and Brilinta (ticagrelor) 90 mg bid.   Therapy recommendations:  pending     Disposition:  pending    Dr. Leonie Man met with wife in the afternoon, who wants pt to be off the vent and full comfort care. Orders put in place  DNR  Acute Respiratory Failure  Intubated for  IR  Left intubated for airway protection  On propofol  CCM on board  Terminal wean  Blood Pressure  No hx HTN  Not on home HTN meds   BP goal per IR x 24h following IR procedure   On Cleviprex gtt  increased SBP goal to < 180 after noon    Dysphagia . Secondary to stroke . 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  He is likely going to need prolonged ventilatory support and feeding tube and 24-hour nursing care to survive have a life of major disability I had.  I had a long discussion with the patient's wife and sister in person and explained his prognosis and they are quite clear that patient would not want to be kept alive with such a poor prognosis.  They agreed to DNR now and likely to withdraw ventilatory support and make him full comfort care tomorrow.  Discussed with critical care MD. This patient is critically ill and at significant risk of neurological worsening, death and care requires constant monitoring of vital signs, hemodynamics,respiratory and cardiac monitoring, extensive review of multiple databases, frequent neurological assessment, discussion with family, other specialists and medical decision making of high complexity.I have made any additions or clarifications directly to the above note.This critical care time does not reflect procedure time, or teaching  time or supervisory time of PA/NP/Med Resident etc but could involve care discussion time.  I spent 40 minutes of neurocritical care time  in the care of  this patient.      Antony Contras, MD Medical Director [facility] Pager: [phone] 10/15/2019 2:08 PM   To contact Stroke Continuity provider, please refer to [email or web address] After hours, contact General Neurology

## 2019-10-15 NOTE — Progress Notes (Signed)
RT attempted to wean pt this morning, pt went apneic and was placed back on full support. RT will continue to monitor.

## 2019-10-15 NOTE — Progress Notes (Signed)
OT Cancellation Note  Patient Details Name: Larry Beltran MRN: [id number] DOB: Oct 28, 1931   Cancelled Treatment:    Reason Eval/Treat Not Completed: Medical issues which prohibited therapy.  Pt on vent.  Will check back.  Nilsa Nutting., OTR/L Acute Rehabilitation Services Pager [phone] Office [phone]   Lucille Passy M 10/15/2019, 9:52 AM

## 2019-10-15 NOTE — Progress Notes (Signed)
Initial Nutrition Assessment  DOCUMENTATION CODES:   Severe malnutrition in context of chronic illness  INTERVENTION:   Initiate Vital AF 1.2 @ 35 ml/hr and increase by 10 ml every 8 hours to goal rate of 55 ml/hr (1320 ml/day) via OG tube  Provides: 1584 kcal, 99 grams protein, and 1070 ml free water.   Monitor magnesium and phosphorus every 12 hours x 4 occurances, MD to replete as needed, as pt is at risk for refeeding syndrome given severe malnutrition.   NUTRITION DIAGNOSIS:   Severe Malnutrition related to chronic illness(cancer) as evidenced by severe muscle depletion, severe fat depletion, moderate muscle depletion.  GOAL:   Patient will meet greater than or equal to 90% of their needs  MONITOR:   TF tolerance, Vent status  REASON FOR ASSESSMENT:   Consult, Ventilator Enteral/tube feeding initiation and management  ASSESSMENT:   Pt with PMH of esophageal cancer undergoing chemo/XRT at [facility], last XRT Nov 2020, current chemo FOLFOX, and HTN now admitted with occluded proximal basilar artery s/p IR for revascularization and stent placement.   Pt remains intubated post procedure. Noted pt awake on vent. Per RN family coming in today to determine [place].   Patient is currently intubated on ventilator support MV: 8.1 L/min Temp (24hrs), Avg:98.4 F (36.9 C), Min:97.9 F (36.6 C), Max:99.4 F (37.4 C)  Propofol: 3 ml/hr (10 mcg) provides: 79 kcal  Cleviprex weaning off Medications reviewed and include: KCl 40 mEq TID (d/c today) Labs reviewed: Na 133 (L), K+ 3.4 (L), TG: 214 (H)    NUTRITION - FOCUSED PHYSICAL EXAM:    Most Recent Value  Orbital Region  Severe depletion  Upper Arm Region  Severe depletion  Thoracic and Lumbar Region  Severe depletion  Buccal Region  Unable to assess  Temple Region  Severe depletion  Clavicle Bone Region  Severe depletion  Clavicle and Acromion Bone Region  Severe depletion  Scapular Bone Region  Severe depletion  Dorsal  Hand  Mild depletion  Patellar Region  Moderate depletion  Anterior Thigh Region  Moderate depletion  Posterior Calf Region  Moderate depletion  Edema (RD Assessment)  None  Hair  Reviewed  Eyes  Reviewed  Mouth  Unable to assess  Skin  Reviewed  Nails  Reviewed       Diet Order:   Diet Order            Diet NPO time specified  Diet effective now              EDUCATION NEEDS:   No education needs have been identified at this time  Skin:  Skin Assessment: Reviewed RN Assessment  Last BM:  unknown  Height:   Ht Readings from Last 1 Encounters:  10/14/19 5\' 6"  (1.676 m)    Weight:   Wt Readings from Last 1 Encounters:  10/14/19 62 kg    Ideal Body Weight:  64.5 kg  BMI:  Body mass index is 22.06 kg/m.  Estimated Nutritional Needs:   Kcal:  1550  Protein:  93-115 grams  Fluid:  > 1.5 L/day  Maylon Peppers RD, LDN, CNSC [phone] Pager [phone] After Hours Pager

## 2019-10-15 NOTE — Progress Notes (Signed)
CDS [id number] - call with time of death

## 2019-10-15 NOTE — Progress Notes (Signed)
SLP Cancellation Note  Patient Details Name: Larry Beltran MRN: [id number] DOB: 10-25-1931   Cancelled treatment:       Reason Eval/Treat Not Completed: Patient not medically ready. Order received for speech language evaluation - pt on the vent. Will f/u for readiness.    Osie Bond., M.A. [facility] Acute Rehabilitation Services Pager [phone] Office [phone]  10/15/2019, 7:57 AM

## 2019-10-15 NOTE — Progress Notes (Signed)
PT Cancellation Note  Patient Details Name: Larry Beltran MRN: [id number] DOB: 1931-12-22   Cancelled Treatment:    Reason Eval/Treat Not Completed: Medical issues which prohibited therapy(Pt weaning and nurse states not medically ready.)Will check back tomorrow.    Denice Paradise 10/15/2019, 9:40 AM  [place] Latulippe W,PT Acute Rehabilitation Services Pager:  [phone]  Office:  [phone]

## 2019-10-15 NOTE — Progress Notes (Signed)
PULMONARY / CRITICAL CARE MEDICINE   NAME:  Cord Straughan, MRN:  [id number], DOB:  1931/11/07, LOS: 1 ADMISSION DATE:  10/14/2019, CONSULTATION DATE: 10/14/2019 REFERRING MD: Interventional radiology, CHIEF COMPLAINT:  ams  BRIEF HISTORY:    86 year old with altered mental status with a basilar artery status post HISTORY OF PRESENT ILLNESS   Is an 86 year old male without past medical history significant for to be esophageal cancer adenocarcinoma for which he has been undergoing chemo radiation therapy at [facility].  Last radiation was in November 2020.  He is currently on chemo with FOLFOX he was in his usual state of health until late evening 10/12/2018 when his wife heard him hit the floor and noted left-sided weakness.  He was transferred to [facility] underwent revascularization of occluded basilar artery with stent placement.  He remains on full mechanical ventilatory support in time of this dictation pulmonary critical care has been asked to manage ventilator.  Currently he is hemodynamically stable with blood pressure cuff pressure 120-161 SIGNIFICANT PAST MEDICAL HISTORY   Esophageal cancer Hypertension  SIGNIFICANT EVENTS:  10/14/2019 revascularization of occluded STUDIES:   CT head with occluded basilar artery  CULTURES:  None  ANTIBIOTICS:  None  LINES/TUBES:  10/14/2019 endotracheal>> 10/14/2019 right radial A-line>> Unknown date right Port-A-Cath in place>>  CONSULTANTS:  10/14/2019 pulmonary critical care SUBJECTIVE:  86 year old male sedated on full mechanical ventilatory support.  CONSTITUTIONAL: BP 122/63   Pulse 95   Temp 99.4 F (37.4 C) (Oral)   Resp 19   Ht 5\' 6"  (1.676 m)   Wt 62 kg   SpO2 100%   BMI 22.06 kg/m   I/O last 3 completed shifts: In: 2396.6 [I.V.:2396.6] Out: 850 [Urine:850]     Vent Mode: PRVC FiO2 (%):  [40 %] 40 % Set Rate:  [14 bmp] 14 bmp Vt Set:  [510 mL] 510 mL PEEP:  [5 cmH20] 5 cmH20 Plateau Pressure:   [14 cmH20] 14 cmH20  PHYSICAL EXAM: General: Acutely ill appearing male, arousable off propofol Neuro: Opens eyes but does not follow commands HEENT: [place]/AT, PERRL, EOM-I and MMM and ETT is in place Cardiovascular: RRR, Nl S1/S2 and -M/R/G Lungs: Coarse diffusely Abdomen: Soft, NT, ND and +BS Musculoskeletal: Grossly intact no focal defect Skin:  Warm and dry  I reviewed CXR myself, no acute disease noted.  Discussed with bedside RN  RESOLVED PROBLEM LIST   ASSESSMENT AND PLAN   Vent dependent respiratory failure secondary to interventional radiology opening occluded basilar artery Place on PS for now until neuro evaluates the patient, extubatable by vent mechanics criteria but would like neuros input Wean FiO2 for sats greater than 92% Adjust vent for ABG VAP prevention  Status post revascularization of occluded proximal basilar artery with intracranial stenting for underlying arterial disease.  Left-sided weakness prior to procedure. Per neurology  Esophageal cancer with radiation and chemotherapy at [facility] last radiation therapy on 08/2019 and currently receiving FOLFOX. May need oncology consult for resumption of chemotherapy  SUMMARY OF TODAY'S PLAN:  Begin TF, replace K and begin weaning trials but no extubation until neuro evaluates  Best Practice / Goals of Care / Disposition.   DVT PROPHYLAXIS: Sequential hose SUP: PPI NUTRITION: N.p.o. MOBILITY: Bedrest GOALS OF CARE: Full code FAMILY DISCUSSIONS: Family updated by neurology DISPOSITION currently in neuro intensive care unit on full mechanical ventilatory support  LABS  Glucose Recent Labs  Lab 10/14/19 0503 10/14/19 0547  GLUCAP 125* 119*  BMET Recent Labs  Lab 10/14/19 0503 10/14/19 1230 10/15/19 0435  NA 139 139 133*  K 4.6 3.8 3.4*  CL 103  --  105  CO2 20*  --  18*  BUN 25*  --  16  CREATININE 0.99  --  0.95  GLUCOSE 129*  --  105*    Liver Enzymes Recent Labs   Lab 10/14/19 0503  AST 37  ALT 33  ALKPHOS 88  BILITOT 0.6  ALBUMIN 4.0    Electrolytes Recent Labs  Lab 10/14/19 0503 10/15/19 0435  CALCIUM 9.4 8.0*  MG  --  2.0  PHOS  --  3.1    CBC Recent Labs  Lab 10/14/19 0503 10/14/19 1230 10/15/19 0435  WBC 7.7  --  6.6  HGB 11.9* 9.5* 8.2*  HCT 35.5* 28.0* 25.9*  PLT 173  --  140*    ABG Recent Labs  Lab 10/14/19 1230  PHART 7.366  PCO2ART 33.6  PO2ART 103.0    Coag's Recent Labs  Lab 10/14/19 0503  APTT 25  INR 1.1    Sepsis Markers No results for input(s): LATICACIDVEN, PROCALCITON, O2SATVEN in the last 168 hours.  Cardiac Enzymes No results for input(s): TROPONINI, PROBNP in the last 168 hours.  The patient is critically ill with multiple organ systems failure and requires high complexity decision making for assessment and support, frequent evaluation and titration of therapies, application of advanced monitoring technologies and extensive interpretation of multiple databases.   Critical Care Time devoted to patient care services described in this note is  35  Minutes. This time reflects time of care of this signee Dr Jennet Maduro. This critical care time does not reflect procedure time, or teaching time or supervisory time of PA/NP/Med student/Med Resident etc but could involve care discussion time.  Rush Farmer, M.D. [facility] Pulmonary/Critical Care Medicine.

## 2019-10-15 NOTE — Progress Notes (Addendum)
NIR.  Acute CVA s/p cerebral arteriogram with emergent mechanical thrombectomy of proximal basilar artery occlusion along with proximal basilar artery stent placement for underlying severe ICAD 10/14/2019 by Dr. Estanislado Pandy.  P2Y12 126 PRU today. Discussed result with Dr. Estanislado Pandy who recommends patient receive an additional Brilinta 90 mg x 1 today (total of Brilinta 270 mg given today). Will make Larry Bamberg, RN aware.  NIR to follow.   Larry Graff Ursala Cressy, PA-C 10/15/2019, 12:41 PM    ADDENDUM: after speaking with Larry Bamberg, RN, family wishes to pursue comfort measures at this time. Will delete order for additional Brilinta. Please call NIR with questions/concerns.

## 2019-10-15 NOTE — Progress Notes (Signed)
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 Estanislado Pandy. Patient laying in bed intubated with sedation. He opens eyes to name. He follows some simple commands (moves bilateral toes and eyes on command). Can spontaneously move all extremities R>L.   Allergies: Patient has no known allergies.  Medications: Prior to Admission medications   Medication Sig Start Date End Date Taking? Authorizing Provider  ferrous gluconate (FERGON) 324 MG tablet Take 324 mg by mouth every morning. 10/03/19  Yes [provider]  loperamide (IMODIUM) 2 MG capsule Take 2 mg by mouth 2 (two) times daily as needed for diarrhea or loose stools.  09/29/19  Yes [provider]  meclizine (ANTIVERT) 25 MG tablet Take 25 mg by mouth 3 (three) times daily as needed for dizziness. 09/28/19  Yes [provider]  ondansetron (ZOFRAN) 8 MG tablet Take 8 mg by mouth every 8 (eight) hours as needed for nausea/vomiting. 09/29/19  Yes [provider]  PRESCRIPTION MEDICATION IV Chemo Medication given at [facility]   Yes [provider]  prochlorperazine (COMPAZINE) 5 MG tablet Take 5 mg by mouth every 6 (six) hours as needed for nausea/vomiting. 09/29/19  Yes [provider]  Skin Protectants, Misc. (EUCERIN) cream Apply 1 application topically 2 (two) times daily. Dry skin on hands and feet 09/29/19  Yes [provider]  sucralfate (CARAFATE) 1 g tablet Take 1 g by mouth 4 (four) times daily. 09/02/19  Yes [provider]     Vital Signs: BP 122/63 (BP Location: Right Arm)    Pulse 95   Temp 99.4 F (37.4 C) (Oral)   Resp 19   Ht 5\' 6"  (1.676 m)   Wt 136 lb 11 oz (62 kg)   SpO2 100%   BMI 22.06 kg/m   Physical Exam Vitals and nursing note reviewed.  Constitutional:      General: He is not in acute distress.    Comments: Intubated with sedation.  Pulmonary:     Effort: Pulmonary effort is normal. No respiratory distress.     Comments: Intubated with sedation. Skin:    General: Skin is warm and dry.     Comments: Right groin and right radial incisions soft without active bleeding or hematoma.  Neurological:     Comments: Intubated with sedation. Opens eyes to name and follows some simple commands (moves bilateral toes and eyes on command), PERRL bilaterally. Can spontaneously move all extremities R>L. Distal pulses (radial and DPs) 1+ bilaterally.  Psychiatric:     Comments: Intubated with sedation.     Imaging: CT Code Stroke CTA Head W/WO contrast  Result Date: 10/14/2019 CLINICAL DATA:  Slurred speech and left-sided weakness EXAM: CT ANGIOGRAPHY HEAD AND NECK CT PERFUSION BRAIN TECHNIQUE: Multidetector CT imaging of the head and neck was performed using the standard protocol during bolus administration of intravenous contrast. Multiplanar CT image reconstructions and MIPs were obtained to evaluate the vascular anatomy. Carotid stenosis measurements (when applicable) are obtained utilizing NASCET criteria, using the distal internal carotid diameter as the denominator. Multiphase CT imaging of the brain was performed following IV bolus contrast injection. Subsequent parametric perfusion maps were calculated using RAPID software. CONTRAST:  Dose is  currently not available COMPARISON:  Noncontrast head CT earlier today FINDINGS: CTA NECK FINDINGS Aortic arch: Atherosclerotic plaque that is extensive. Four vessel branching. Right carotid system: Relatively mild atherosclerotic plaque at the bifurcation without flow limiting stenosis. No ulceration. Left  carotid system: Partial retropharyngeal course. Relatively mild atherosclerotic calcification mainly at the bifurcation without flow limiting stenosis or ulceration. Vertebral arteries: Left vertebral artery arises from the arch. Moderate atheromatous type narrowing at the right V1 segment. Both vertebral arteries are patent at the level of the dural penetration. Skeleton: Spinal degeneration and spondylosis. No acute or aggressive finding Other neck: Port in place. Upper chest: Distended esophagus containing semi solid appearing material were covered. Review of the MIP images confirms the above findings CTA HEAD FINDINGS Anterior circulation: Atherosclerotic plaque along the carotid siphons. No branch occlusion, beading, or aneurysm. Posterior circulation: Dominant right vertebral artery. There is high-grade narrowing of the left V4 segment. Although the basilar is likely hypoplastic in the setting of fetal type left PCA, there is superimposed thready flow followed by occlusion with no flow seen at the basilar tip or throughout the large majority of the right PCA. High-grade left P2 segment stenosis. Venous sinuses: Patent Anatomic variants: As above Review of the MIP images confirms the above findings CT Brain Perfusion Findings: ASPECTS: 10 CBF (<30%) Volume: 110mL Perfusion (Tmax>6.0s) volume: 22mL-at the level of the pons Critical Value/emergent results were called by telephone at the time of interpretation on 10/14/2019 at 5:28 am to [place] , who verbally acknowledged these results. IMPRESSION: 1. Occluded mid to distal basilar and right PCA. There is correlative abnormal perfusion at the level of the upper pons. 2. High-grade left P2 and left V4 segment stenoses. 3. No flow limiting stenosis or large vessel occlusion in the anterior circulation. 4. Distended upper thoracic esophagus likely containing debris. Electronically Signed   By: Monte Fantasia M.D.   On: 10/14/2019 05:32   CT Code  Stroke CTA Neck W/WO contrast  Result Date: 10/14/2019 CLINICAL DATA:  Slurred speech and left-sided weakness EXAM: CT ANGIOGRAPHY HEAD AND NECK CT PERFUSION BRAIN TECHNIQUE: Multidetector CT imaging of the head and neck was performed using the standard protocol during bolus administration of intravenous contrast. Multiplanar CT image reconstructions and MIPs were obtained to evaluate the vascular anatomy. Carotid stenosis measurements (when applicable) are obtained utilizing NASCET criteria, using the distal internal carotid diameter as the denominator. Multiphase CT imaging of the brain was performed following IV bolus contrast injection. Subsequent parametric perfusion maps were calculated using RAPID software. CONTRAST:  Dose is currently not available COMPARISON:  Noncontrast head CT earlier today FINDINGS: CTA NECK FINDINGS Aortic arch: Atherosclerotic plaque that is extensive. Four vessel branching. Right carotid system: Relatively mild atherosclerotic plaque at the bifurcation without flow limiting stenosis. No ulceration. Left carotid system: Partial retropharyngeal course. Relatively mild atherosclerotic calcification mainly at the bifurcation without flow limiting stenosis or ulceration. Vertebral arteries: Left vertebral artery arises from the arch. Moderate atheromatous type narrowing at the right V1 segment. Both vertebral arteries are patent at the level of the dural penetration. Skeleton: Spinal degeneration and spondylosis. No acute or aggressive finding Other neck: Port in place. Upper chest: Distended esophagus containing semi solid appearing material were covered. Review of the MIP images confirms the above findings CTA HEAD FINDINGS Anterior circulation: Atherosclerotic plaque along the carotid siphons. No branch occlusion, beading, or aneurysm. Posterior circulation: Dominant right vertebral artery. There is high-grade narrowing of the left V4 segment. Although the basilar is  likely  hypoplastic in the setting of fetal type left PCA, there is superimposed thready flow followed by occlusion with no flow seen at the basilar tip or throughout the large majority of the right PCA. High-grade left P2 segment stenosis. Venous sinuses: Patent Anatomic variants: As above Review of the MIP images confirms the above findings CT Brain Perfusion Findings: ASPECTS: 10 CBF (<30%) Volume: 27mL Perfusion (Tmax>6.0s) volume: 54mL-at the level of the pons Critical Value/emergent results were called by telephone at the time of interpretation on 10/14/2019 at 5:28 am to [place] , who verbally acknowledged these results. IMPRESSION: 1. Occluded mid to distal basilar and right PCA. There is correlative abnormal perfusion at the level of the upper pons. 2. High-grade left P2 and left V4 segment stenoses. 3. No flow limiting stenosis or large vessel occlusion in the anterior circulation. 4. Distended upper thoracic esophagus likely containing debris. Electronically Signed   By: Monte Fantasia M.D.   On: 10/14/2019 05:32   MR BRAIN WO CONTRAST  Result Date: 10/14/2019 CLINICAL DATA:  Slurred speech and left-sided weakness. Follow-up basilar and right PCA occlusion EXAM: MRI HEAD WITHOUT CONTRAST TECHNIQUE: Multiplanar, multiecho pulse sequences of the brain and surrounding structures were obtained without intravenous contrast. COMPARISON:  CT studies earlier today FINDINGS: Brain: Diffusion imaging shows a 2 cm region of acute infarction affecting the pons. Scattered small acute infarctions are present within the right cerebellar hemisphere inferior to superior. No acute infarction in the left cerebellar hemisphere. No supratentorial infarction. Specifically, thalami and occipital lobes are intact. No evidence of hemorrhage in the regions of acute infarction. There is an old infarction in the right lateral pons. Cerebral hemispheres show mild chronic small-vessel change of the white matter considering  age. No hydrocephalus or extra-axial collection. Vascular: Possible slow flow in the vertebrobasilar system. Skull and upper cervical spine: Negative Sinuses/Orbits: Mucosal thickening of the paranasal sinuses. Fluid in the nasopharynx often seen with intubation. Other: None IMPRESSION: 2 cm acute infarction affecting the central pons. No visible hemorrhage. Scattered small infarctions within the right cerebellum inferior to superior. Old infarction right lateral pons. Mild chronic small-vessel ischemic change of the cerebral hemispheric white matter. Electronically Signed   By: Whitener Chimes M.D.   On: 10/14/2019 15:44   CT C-SPINE NO CHARGE  Result Date: 10/14/2019 CLINICAL DATA:  Neurologic deficits, fall EXAM: CT CERVICAL SPINE WITHOUT CONTRAST TECHNIQUE: Multidetector CT imaging of the cervical spine was performed without intravenous contrast. Multiplanar CT image reconstructions were also generated. COMPARISON:  Same day CT angiography of the head neck FINDINGS: Alignment: Slight exaggeration of the normal cervical lordosis. Mild anterolisthesis of C4 on C5 likely on a facet degenerative basis. No abnormally perched, jump to widened facets. Craniocervical into axial alignment is maintained. Skull base and vertebrae: Degenerative appearing fusion of the left articular facet at C2-3. Likely degenerative fusion of the right articular facets at C5-6 as well. No acute fracture. No primary bone lesion or focal pathologic process. Soft tissues and spinal canal: No pre or paravertebral fluid or swelling. No visible canal hematoma. Disc levels: Multilevel moderate to severe intervertebral disc height loss with vacuum phenomenon at C3-4 and C6-7 as well as multilevel cervical spondylitic endplate changes with advanced uncinate spurring and facet hypertrophic changes well. Findings result in at least moderate canal stenosis at C5-6 and C6-7 as well as multilevel mild-to-moderate neural foraminal narrowing. Degenerative  changes are milder in the upper thoracic spine. Upper chest: Secretions noted in the posterior oropharynx. Debris  noted in the upper thoracic esophagus. Biapical pleuroparenchymal scarring is present. Other: Thyroid gland is unremarkable. Cervical carotid atherosclerosis better assessed on CT angiography. IMPRESSION: 1. No acute osseous abnormality or traumatic listhesis. 2. Slight exaggeration of the normal cervical lordosis with mild anterolisthesis of C4 on C5 likely on a facet degenerative basis. Degenerative fusion of the left articular facet at C2-3 and right articular facet at C5-6. 3. Multilevel moderate to severe cervical spondylosis with advanced uncinate spurring and facet hypertrophic changes resulting in at least moderate canal stenosis at C5-6 and C6-7 as well as multilevel mild-to-moderate neural foraminal narrowing. 4. Secretions noted in the upper thoracic esophagus. Correlate for reflux. 5. Cervical carotid atherosclerosis better assessed on CT angiography. Electronically Signed   By: Lovena Le M.D.   On: 10/14/2019 05:57   CT Code Stroke Cerebral Perfusion with contrast  Result Date: 10/14/2019 CLINICAL DATA:  Slurred speech and left-sided weakness EXAM: CT ANGIOGRAPHY HEAD AND NECK CT PERFUSION BRAIN TECHNIQUE: Multidetector CT imaging of the head and neck was performed using the standard protocol during bolus administration of intravenous contrast. Multiplanar CT image reconstructions and MIPs were obtained to evaluate the vascular anatomy. Carotid stenosis measurements (when applicable) are obtained utilizing NASCET criteria, using the distal internal carotid diameter as the denominator. Multiphase CT imaging of the brain was performed following IV bolus contrast injection. Subsequent parametric perfusion maps were calculated using RAPID software. CONTRAST:  Dose is currently not available COMPARISON:  Noncontrast head CT earlier today FINDINGS: CTA NECK FINDINGS Aortic arch:  Atherosclerotic plaque that is extensive. Four vessel branching. Right carotid system: Relatively mild atherosclerotic plaque at the bifurcation without flow limiting stenosis. No ulceration. Left carotid system: Partial retropharyngeal course. Relatively mild atherosclerotic calcification mainly at the bifurcation without flow limiting stenosis or ulceration. Vertebral arteries: Left vertebral artery arises from the arch. Moderate atheromatous type narrowing at the right V1 segment. Both vertebral arteries are patent at the level of the dural penetration. Skeleton: Spinal degeneration and spondylosis. No acute or aggressive finding Other neck: Port in place. Upper chest: Distended esophagus containing semi solid appearing material were covered. Review of the MIP images confirms the above findings CTA HEAD FINDINGS Anterior circulation: Atherosclerotic plaque along the carotid siphons. No branch occlusion, beading, or aneurysm. Posterior circulation: Dominant right vertebral artery. There is high-grade narrowing of the left V4 segment. Although the basilar is likely hypoplastic in the setting of fetal type left PCA, there is superimposed thready flow followed by occlusion with no flow seen at the basilar tip or throughout the large majority of the right PCA. High-grade left P2 segment stenosis. Venous sinuses: Patent Anatomic variants: As above Review of the MIP images confirms the above findings CT Brain Perfusion Findings: ASPECTS: 10 CBF (<30%) Volume: 33mL Perfusion (Tmax>6.0s) volume: 59mL-at the level of the pons Critical Value/emergent results were called by telephone at the time of interpretation on 10/14/2019 at 5:28 am to [place] , who verbally acknowledged these results. IMPRESSION: 1. Occluded mid to distal basilar and right PCA. There is correlative abnormal perfusion at the level of the upper pons. 2. High-grade left P2 and left V4 segment stenoses. 3. No flow limiting stenosis or large  vessel occlusion in the anterior circulation. 4. Distended upper thoracic esophagus likely containing debris. Electronically Signed   By: Monte Fantasia M.D.   On: 10/14/2019 05:32   DG CHEST PORT 1 VIEW  Result Date: 10/14/2019 CLINICAL DATA:  Post op/ ETT adjusted. EXAM: PORTABLE CHEST 1 VIEW COMPARISON:  Radiograph 10/14/2019 FINDINGS: Endotracheal to 5 cm from carina. Port in the anterior chest wall with tip in distal SVC. NG tube in stomach. Stable cardiac silhouette. Small LEFT effusion. Hyperinflated lungs. No pneumonia. IMPRESSION: 1. No tracheal tube in good position. 2. Small LEFT effusion. 3. Hyperinflated lungs mild basilar atelectasis. Electronically Signed   By: Suzy Bouchard M.D.   On: 10/14/2019 13:23   DG CHEST PORT 1 VIEW  Result Date: 10/14/2019 CLINICAL DATA:  ETT placement EXAM: PORTABLE CHEST 1 VIEW COMPARISON:  None. FINDINGS: Endotracheal tube with the tip 6 cm above carina. NG tube coursing below the diaphragm. Port a cath with the tip projecting over the SVC. Bilateral mild interstitial thickening. Hazy right lower lobe airspace disease. Trace left pleural effusion. Normal heart size. Opacity in the upper mediastinum and partially projecting over the right lung apex. Thoracic aortic atherosclerosis. No aggressive osseous lesion. IMPRESSION: 1. Support lines and tubing as described above. 2. Opacity in the upper mediastinum and partially projecting over the right lung apex likely artifactual. 3. Trace left pleural effusion. Hazy right lower lobe airspace disease which may reflect atelectasis versus pneumonia. 4. Electronically Signed   By: Kathreen Devoid   On: 10/14/2019 12:19   ECHOCARDIOGRAM COMPLETE  Result Date: 10/14/2019   ECHOCARDIOGRAM REPORT   Patient Name:   Larry Beltran Date of Exam: 10/14/2019 Medical Rec #:  [id number]    Height:       66.0 in Accession #:    [id number]   Weight:       136.7 lb Date of Birth:  [birth year]     BSA:          1.70 m Patient Age:    27 years      BP:           131/63 mmHg Patient Gender: M            HR:           91 bpm. Exam Location:  Inpatient Procedure: 2D Echo, Cardiac Doppler and Color Doppler Indications:    CVA  History:        Patient has no prior history of Echocardiogram examinations.                 Risk Factors:Hypertension. Esophageal cancer, chemo, resp.                 failure.  Sonographer:    Dustin Flock Referring Phys: [place]  1. Left ventricular ejection fraction, by visual estimation, is 65 to 70%. The left ventricle has hyperdynamic function. There is no left ventricular hypertrophy.  2. Left ventricular diastolic parameters are consistent with Grade II diastolic dysfunction (pseudonormalization).  3. The left ventricle has no regional wall motion abnormalities.  4. Global right ventricle has normal systolic function.The right ventricular size is normal. No increase in right ventricular wall thickness.  5. Left atrial size was normal.  6. Right atrial size was normal.  7. The mitral valve is normal in structure. Trivial mitral valve regurgitation.  8. The tricuspid valve is normal in structure.  9. The aortic valve is tricuspid. Aortic valve regurgitation is not visualized. Mild aortic valve sclerosis without stenosis. 10. The pulmonic valve was not well visualized. Pulmonic valve regurgitation is not visualized. 11. Moderately elevated pulmonary artery systolic pressure. 12. The tricuspid regurgitant velocity is 3.50 m/s, and with an assumed right atrial pressure of 8 mmHg, the estimated right ventricular systolic pressure is moderately  elevated at 57.0 mmHg. 13. The inferior vena cava is normal in size with <50% respiratory variability, suggesting right atrial pressure of 8 mmHg. 14. The atrial septum is grossly normal. 15. Cannot exclude ASD/PFO. Consider transesophageal echocardiogram, if clinically indicated. FINDINGS  Left Ventricle: Left ventricular ejection fraction, by visual estimation,  is 65 to 70%. The left ventricle has hyperdynamic function. The left ventricle has no regional wall motion abnormalities. There is no left ventricular hypertrophy. Left ventricular diastolic parameters are consistent with Grade II diastolic dysfunction (pseudonormalization). Right Ventricle: The right ventricular size is normal. No increase in right ventricular wall thickness. Global RV systolic function is has normal systolic function. The tricuspid regurgitant velocity is 3.50 m/s, and with an assumed right atrial pressure  of 8 mmHg, the estimated right ventricular systolic pressure is moderately elevated at 57.0 mmHg. Left Atrium: Left atrial size was normal in size. Right Atrium: Right atrial size was normal in size Pericardium: There is no evidence of pericardial effusion. Mitral Valve: The mitral valve is normal in structure. There is mild thickening of the mitral valve leaflet(s). Trivial mitral valve regurgitation. Tricuspid Valve: The tricuspid valve is normal in structure. Tricuspid valve regurgitation mild-moderate. Aortic Valve: The aortic valve is tricuspid. . There is mild thickening and mild calcification of the aortic valve. Aortic valve regurgitation is not visualized. Mild aortic valve sclerosis is present, with no evidence of aortic valve stenosis. There is mild thickening of the aortic valve. There is mild calcification of the aortic valve. Pulmonic Valve: The pulmonic valve was not well visualized. Pulmonic valve regurgitation is not visualized. Pulmonic regurgitation is not visualized. Aorta: The aortic root, ascending aorta and aortic arch are all structurally normal, with no evidence of dilitation or obstruction. Pulmonary Artery: The pulmonary artery is not well seen. Venous: The inferior vena cava is normal in size with less than 50% respiratory variability, suggesting right atrial pressure of 8 mmHg. IAS/Shunts: The atrial septum is grossly normal. Additional Comments: Flow jet at the  atrial septum was seen, possible venous return; however, could not exclude ASD/PFO. If clinically indicated consider transesophageal echocardiogram.  LEFT VENTRICLE PLAX 2D LVIDd:         3.90 cm  Diastology LVIDs:         1.70 cm  LV e' lateral:   6.74 cm/s LV PW:         1.00 cm  LV E/e' lateral: 16.3 LV IVS:        1.00 cm  LV e' medial:    5.98 cm/s LVOT diam:     2.00 cm  LV E/e' medial:  18.4 LV SV:         58 ml LV SV Index:   33.82 LVOT Area:     3.14 cm  RIGHT VENTRICLE RV Basal diam:  3.20 cm RV S prime:     16.00 cm/s TAPSE (M-mode): 2.6 cm LEFT ATRIUM           Index       RIGHT ATRIUM           Index LA diam:      2.90 cm 1.70 cm/m  RA Area:     13.50 cm LA Vol (A4C): 27.1 ml 15.93 ml/m RA Volume:   33.30 ml  19.58 ml/m  AORTIC VALVE LVOT Vmax:   114.00 cm/s LVOT Vmean:  67.900 cm/s LVOT VTI:    0.215 m  AORTA Ao Root diam: 3.20 cm MITRAL VALVE  TRICUSPID VALVE MV Area (PHT): 7.37 cm              TR Peak grad:   49.0 mmHg MV PHT:        29.87 msec            TR Vmax:        354.00 cm/s MV Decel Time: 103 msec MV E velocity: 110.00 cm/s 103 cm/s  SHUNTS MV A velocity: 158.00 cm/s 70.3 cm/s Systemic VTI:  0.22 m MV E/A ratio:  0.70        1.5       Systemic Diam: 2.00 cm  Buford Dresser MD Electronically signed by Buford Dresser MD Signature Date/Time: 10/14/2019/5:46:44 PM    Final    CT HEAD CODE STROKE WO CONTRAST  Result Date: 10/14/2019 CLINICAL DATA:  Code stroke.  Slurred speech and left-sided weakness EXAM: CT HEAD WITHOUT CONTRAST TECHNIQUE: Contiguous axial images were obtained from the base of the skull through the vertex without intravenous contrast. COMPARISON:  None. FINDINGS: Brain: No evidence of acute infarction, hemorrhage, hydrocephalus, extra-axial collection or mass lesion/mass effect. Mild chronic small vessel ischemia and volume loss for age. Vascular: No hyperdense vessel or unexpected calcification. Skull: Normal. Negative for fracture  or focal lesion. Sinuses/Orbits: Negative Other: These results were communicated to Dr. Leonel Ramsay at 5:12 amon 1/7/2021by text page via the [facility] messaging system. ASPECTS [facility] Stroke Program Early CT Score) - Ganglionic level infarction (caudate, lentiform nuclei, internal capsule, insula, M1-M3 cortex): 7 - Supraganglionic infarction (M4-M6 cortex): 3 Total score (0-10 with 10 being normal): 10 IMPRESSION: No acute finding.  ASPECTS is 10. Electronically Signed   By: Monte Fantasia M.D.   On: 10/14/2019 05:13    Labs:  CBC: Recent Labs    10/14/19 0503 10/14/19 0642 10/14/19 1230 10/15/19 0435  WBC 7.7  --   --  6.6  HGB 11.9* 11.9* 9.5* 8.2*  HCT 35.5* 35.0* 28.0* 25.9*  PLT 173  --   --  140*    COAGS: Recent Labs    10/14/19 0503  INR 1.1  APTT 25    BMP: Recent Labs    10/14/19 0503 10/14/19 0642 10/14/19 1230 10/15/19 0435  NA 139 137 139 133*  K 4.6 4.2 3.8 3.4*  CL 103 104  --  105  CO2 20*  --   --  18*  GLUCOSE 129* 117*  --  105*  BUN 25* 22  --  16  CALCIUM 9.4  --   --  8.0*  CREATININE 0.99 0.80  --  0.95  GFRNONAA >60  --   --  >60  GFRAA >60  --   --  >60    LIVER FUNCTION TESTS: Recent Labs    10/14/19 0503  BILITOT 0.6  AST 37  ALT 33  ALKPHOS 88  PROT 6.8  ALBUMIN 4.0    Assessment and Plan:  Acute CVA s/p cerebral arteriogram with emergent mechanical thrombectomy of proximal basilar artery occlusion along with proximal basilar artery stent placement for underlying severe ICAD 10/14/2019 by Dr. Estanislado Pandy. Patient's condition stable- remains intubated with sedation, opens eyes to name and follows some simple commands (moves bilateral toes and eyes on command), can spontaneously move all extremities R>L. Right groin and right radial incisions stable, distal pulses (radial and DP) 1+ bilaterally. Continue taking Brilinta 90 mg twice daily and Aspirin 81 mg once daily- will obtain P2Y12 today to check effectiveness of DAPT. For  possible extubation  today per RN. Further plans per neurology/CCM- appreciate and agree with management. NIR to follow.   Electronically Signed: Earley Abide, PA-C 10/15/2019, 8:46 AM   I spent a total of 25 Minutes at the the patient's bedside AND on the patient's hospital floor or unit, greater than 50% of which was counseling/coordinating care for proximal basilar artery occlusion s/p revascularization.

## 2019-10-16 DIAGNOSIS — R1312 Dysphagia, oropharyngeal phase: Secondary | ICD-10-CM

## 2019-10-16 DIAGNOSIS — I6322 Cerebral infarction due to unspecified occlusion or stenosis of basilar arteries: Secondary | ICD-10-CM

## 2019-10-16 DIAGNOSIS — R509 Fever, unspecified: Secondary | ICD-10-CM

## 2019-10-16 DIAGNOSIS — Z978 Presence of other specified devices: Secondary | ICD-10-CM

## 2019-10-16 LAB — MAGNESIUM: Magnesium: 1.9 mg/dL (ref 1.7–2.4)

## 2019-10-16 LAB — PHOSPHORUS: Phosphorus: 3.6 mg/dL (ref 2.5–4.6)

## 2019-10-16 MED ORDER — TICAGRELOR 90 MG PO TABS: 90.0000 mg | ORAL_TABLET | Freq: Two times a day (BID) | ORAL | Status: DC

## 2019-10-16 MED ORDER — ASPIRIN 81 MG PO CHEW
81.0000 mg | CHEWABLE_TABLET | Freq: Every day | ORAL | Status: DC
  Filled 2019-10-16: qty 1

## 2019-10-16 MED ORDER — WHITE PETROLATUM EX OINT
TOPICAL_OINTMENT | CUTANEOUS | Status: AC
  Filled 2019-10-16: qty 28.35

## 2019-10-16 MED ORDER — FENTANYL CITRATE (PF) 100 MCG/2ML IJ SOLN
25.0000 ug | Freq: Once | INTRAMUSCULAR | Status: AC
  Administered 2019-10-16: 05:00:00 25 ug via INTRAVENOUS
  Filled 2019-10-16: qty 2

## 2019-10-16 MED ORDER — ASPIRIN EC 81 MG PO TBEC: 81.0000 mg | DELAYED_RELEASE_TABLET | Freq: Every day | ORAL | Status: DC

## 2019-10-16 MED ORDER — PRO-STAT SUGAR FREE PO LIQD
30.0000 mL | Freq: Two times a day (BID) | ORAL | Status: DC
  Administered 2019-10-16 – 2019-10-18 (×4): 30 mL
  Filled 2019-10-16 (×4): qty 30

## 2019-10-16 MED ORDER — SODIUM CHLORIDE 0.9 % IV SOLN
3.0000 g | Freq: Four times a day (QID) | INTRAVENOUS | Status: DC
  Administered 2019-10-16 – 2019-10-18 (×6): 3 g via INTRAVENOUS
  Filled 2019-10-16: qty 3
  Filled 2019-10-16 (×3): qty 8
  Filled 2019-10-16: qty 3
  Filled 2019-10-16 (×2): qty 8
  Filled 2019-10-16 (×3): qty 3

## 2019-10-16 MED ORDER — TICAGRELOR 90 MG PO TABS
90.0000 mg | ORAL_TABLET | Freq: Two times a day (BID) | ORAL | Status: DC
  Administered 2019-10-16 – 2019-10-18 (×4): 90 mg
  Filled 2019-10-16 (×4): qty 1

## 2019-10-16 MED ORDER — ATORVASTATIN CALCIUM 80 MG PO TABS: 80.0000 mg | ORAL_TABLET | Freq: Every day | ORAL | Status: DC

## 2019-10-16 MED ORDER — VITAL HIGH PROTEIN PO LIQD
1000.0000 mL | ORAL | Status: DC
  Administered 2019-10-17 – 2019-10-18 (×2): 1000 mL

## 2019-10-16 MED ORDER — ATORVASTATIN CALCIUM 80 MG PO TABS
80.0000 mg | ORAL_TABLET | Freq: Every day | ORAL | Status: DC
  Administered 2019-10-16 – 2019-10-17 (×2): 80 mg
  Filled 2019-10-16 (×2): qty 1

## 2019-10-16 NOTE — Progress Notes (Signed)
STROKE TEAM PROGRESS NOTE   INTERVAL HISTORY Pt still intubated. Eyes open on voice, able to move bilaterally horizontally, however, not quite following commands. BUEs increased tone, slight withdraw to pain, followed by flexion posturing. BLEs slight withdraw to pain. Family has requested comfort care measures based on pt wishes, CCM is coordinating with family to initiate extubation and comfort care measures.   Vitals:   10/16/19 0700 10/16/19 0730 10/16/19 0800 10/16/19 0837  BP: 132/73  (!) 128/48 (!) 128/48  Pulse: 68 (!) 108 60 65  Resp: 14 17 14 14   Temp:      TempSrc:      SpO2: 100% 100% 100% 100%  Weight:      Height:        CBC:  Recent Labs  Lab 10/14/19 0503 10/14/19 1230 10/15/19 0435  WBC 7.7  --  6.6  NEUTROABS 6.6  --  5.4  HGB 11.9* 9.5* 8.2*  HCT 35.5* 28.0* 25.9*  MCV 88.5  --  90.9  PLT 173  --  140*    Basic Metabolic Panel:  Recent Labs  Lab 10/14/19 0503 10/14/19 0642 10/14/19 1230 10/15/19 0435  NA 139 137 139 133*  K 4.6 4.2 3.8 3.4*  CL 103 104  --  105  CO2 20*  --   --  18*  GLUCOSE 129* 117*  --  105*  BUN 25* 22  --  16  CREATININE 0.99 0.80  --  0.95  CALCIUM 9.4  --   --  8.0*  MG  --   --   --  2.0  PHOS  --   --   --  3.1   Lipid Panel:     Component Value Date/Time   CHOL 198 10/15/2019 0435   TRIG 214 (H) 10/15/2019 0435   TRIG 209 (H) 10/15/2019 0435   HDL 27 (L) 10/15/2019 0435   CHOLHDL 7.3 10/15/2019 0435   VLDL 42 (H) 10/15/2019 0435   LDLCALC 129 (H) 10/15/2019 0435   HgbA1c:  Lab Results  Component Value Date   HGBA1C 6.6 (H) 10/15/2019   Urine Drug Screen: No results found for: LABOPIA, COCAINSCRNUR, LABBENZ, AMPHETMU, THCU, LABBARB  Alcohol Level     Component Value Date/Time   ETH <10 10/14/2019 0503    IMAGING past 48 hours MR BRAIN WO CONTRAST  Result Date: 10/14/2019 CLINICAL DATA:  Slurred speech and left-sided weakness. Follow-up basilar and right PCA occlusion EXAM: MRI HEAD WITHOUT CONTRAST  TECHNIQUE: Multiplanar, multiecho pulse sequences of the brain and surrounding structures were obtained without intravenous contrast. COMPARISON:  CT studies earlier today FINDINGS: Brain: Diffusion imaging shows a 2 cm region of acute infarction affecting the pons. Scattered small acute infarctions are present within the right cerebellar hemisphere inferior to superior. No acute infarction in the left cerebellar hemisphere. No supratentorial infarction. Specifically, thalami and occipital lobes are intact. No evidence of hemorrhage in the regions of acute infarction. There is an old infarction in the right lateral pons. Cerebral hemispheres show mild chronic small-vessel change of the white matter considering age. No hydrocephalus or extra-axial collection. Vascular: Possible slow flow in the vertebrobasilar system. Skull and upper cervical spine: Negative Sinuses/Orbits: Mucosal thickening of the paranasal sinuses. Fluid in the nasopharynx often seen with intubation. Other: None IMPRESSION: 2 cm acute infarction affecting the central pons. No visible hemorrhage. Scattered small infarctions within the right cerebellum inferior to superior. Old infarction right lateral pons. Mild chronic small-vessel ischemic change of the cerebral  hemispheric white matter. Electronically Signed   By: Mirarchi Chimes M.D.   On: 10/14/2019 15:44   DG CHEST PORT 1 VIEW  Result Date: 10/14/2019 CLINICAL DATA:  Post op/ ETT adjusted. EXAM: PORTABLE CHEST 1 VIEW COMPARISON:  Radiograph 10/14/2019 FINDINGS: Endotracheal to 5 cm from carina. Port in the anterior chest wall with tip in distal SVC. NG tube in stomach. Stable cardiac silhouette. Small LEFT effusion. Hyperinflated lungs. No pneumonia. IMPRESSION: 1. No tracheal tube in good position. 2. Small LEFT effusion. 3. Hyperinflated lungs mild basilar atelectasis. Electronically Signed   By: Suzy Bouchard M.D.   On: 10/14/2019 13:23   DG CHEST PORT 1 VIEW  Result Date:  10/14/2019 CLINICAL DATA:  ETT placement EXAM: PORTABLE CHEST 1 VIEW COMPARISON:  None. FINDINGS: Endotracheal tube with the tip 6 cm above carina. NG tube coursing below the diaphragm. Port a cath with the tip projecting over the SVC. Bilateral mild interstitial thickening. Hazy right lower lobe airspace disease. Trace left pleural effusion. Normal heart size. Opacity in the upper mediastinum and partially projecting over the right lung apex. Thoracic aortic atherosclerosis. No aggressive osseous lesion. IMPRESSION: 1. Support lines and tubing as described above. 2. Opacity in the upper mediastinum and partially projecting over the right lung apex likely artifactual. 3. Trace left pleural effusion. Hazy right lower lobe airspace disease which may reflect atelectasis versus pneumonia. 4. Electronically Signed   By: Kathreen Devoid   On: 10/14/2019 12:19   ECHOCARDIOGRAM COMPLETE  Result Date: 10/14/2019   ECHOCARDIOGRAM REPORT   Patient Name:   COURT DEISTER Date of Exam: 10/14/2019 Medical Rec #:  [id number]    Height:       66.0 in Accession #:    [id number]   Weight:       136.7 lb Date of Birth:  09-22-1932     BSA:          1.70 m Patient Age:    86 years     BP:           131/63 mmHg Patient Gender: M            HR:           91 bpm. Exam Location:  Inpatient Procedure: 2D Echo, Cardiac Doppler and Color Doppler Indications:    CVA  History:        Patient has no prior history of Echocardiogram examinations.                 Risk Factors:Hypertension. Esophageal cancer, chemo, resp.                 failure.  Sonographer:    Dustin Flock Referring Phys: [place]  1. Left ventricular ejection fraction, by visual estimation, is 65 to 70%. The left ventricle has hyperdynamic function. There is no left ventricular hypertrophy.  2. Left ventricular diastolic parameters are consistent with Grade II diastolic dysfunction (pseudonormalization).  3. The left ventricle has no regional wall  motion abnormalities.  4. Global right ventricle has normal systolic function.The right ventricular size is normal. No increase in right ventricular wall thickness.  5. Left atrial size was normal.  6. Right atrial size was normal.  7. The mitral valve is normal in structure. Trivial mitral valve regurgitation.  8. The tricuspid valve is normal in structure.  9. The aortic valve is tricuspid. Aortic valve regurgitation is not visualized. Mild aortic valve sclerosis without stenosis. 10. The pulmonic valve  was not well visualized. Pulmonic valve regurgitation is not visualized. 11. Moderately elevated pulmonary artery systolic pressure. 12. The tricuspid regurgitant velocity is 3.50 m/s, and with an assumed right atrial pressure of 8 mmHg, the estimated right ventricular systolic pressure is moderately elevated at 57.0 mmHg. 13. The inferior vena cava is normal in size with <50% respiratory variability, suggesting right atrial pressure of 8 mmHg. 14. The atrial septum is grossly normal. 15. Cannot exclude ASD/PFO. Consider transesophageal echocardiogram, if clinically indicated. FINDINGS  Left Ventricle: Left ventricular ejection fraction, by visual estimation, is 65 to 70%. The left ventricle has hyperdynamic function. The left ventricle has no regional wall motion abnormalities. There is no left ventricular hypertrophy. Left ventricular diastolic parameters are consistent with Grade II diastolic dysfunction (pseudonormalization). Right Ventricle: The right ventricular size is normal. No increase in right ventricular wall thickness. Global RV systolic function is has normal systolic function. The tricuspid regurgitant velocity is 3.50 m/s, and with an assumed right atrial pressure  of 8 mmHg, the estimated right ventricular systolic pressure is moderately elevated at 57.0 mmHg. Left Atrium: Left atrial size was normal in size. Right Atrium: Right atrial size was normal in size Pericardium: There is no evidence of  pericardial effusion. Mitral Valve: The mitral valve is normal in structure. There is mild thickening of the mitral valve leaflet(s). Trivial mitral valve regurgitation. Tricuspid Valve: The tricuspid valve is normal in structure. Tricuspid valve regurgitation mild-moderate. Aortic Valve: The aortic valve is tricuspid. . There is mild thickening and mild calcification of the aortic valve. Aortic valve regurgitation is not visualized. Mild aortic valve sclerosis is present, with no evidence of aortic valve stenosis. There is mild thickening of the aortic valve. There is mild calcification of the aortic valve. Pulmonic Valve: The pulmonic valve was not well visualized. Pulmonic valve regurgitation is not visualized. Pulmonic regurgitation is not visualized. Aorta: The aortic root, ascending aorta and aortic arch are all structurally normal, with no evidence of dilitation or obstruction. Pulmonary Artery: The pulmonary artery is not well seen. Venous: The inferior vena cava is normal in size with less than 50% respiratory variability, suggesting right atrial pressure of 8 mmHg. IAS/Shunts: The atrial septum is grossly normal. Additional Comments: Flow jet at the atrial septum was seen, possible venous return; however, could not exclude ASD/PFO. If clinically indicated consider transesophageal echocardiogram.  LEFT VENTRICLE PLAX 2D LVIDd:         3.90 cm  Diastology LVIDs:         1.70 cm  LV e' lateral:   6.74 cm/s LV PW:         1.00 cm  LV E/e' lateral: 16.3 LV IVS:        1.00 cm  LV e' medial:    5.98 cm/s LVOT diam:     2.00 cm  LV E/e' medial:  18.4 LV SV:         58 ml LV SV Index:   33.82 LVOT Area:     3.14 cm  RIGHT VENTRICLE RV Basal diam:  3.20 cm RV S prime:     16.00 cm/s TAPSE (M-mode): 2.6 cm LEFT ATRIUM           Index       RIGHT ATRIUM           Index LA diam:      2.90 cm 1.70 cm/m  RA Area:     13.50 cm LA Vol (A4C): 27.1 ml 15.93 ml/m RA  Volume:   33.30 ml  19.58 ml/m  AORTIC VALVE LVOT  Vmax:   114.00 cm/s LVOT Vmean:  67.900 cm/s LVOT VTI:    0.215 m  AORTA Ao Root diam: 3.20 cm MITRAL VALVE                         TRICUSPID VALVE MV Area (PHT): 7.37 cm              TR Peak grad:   49.0 mmHg MV PHT:        29.87 msec            TR Vmax:        354.00 cm/s MV Decel Time: 103 msec MV E velocity: 110.00 cm/s 103 cm/s  SHUNTS MV A velocity: 158.00 cm/s 70.3 cm/s Systemic VTI:  0.22 m MV E/A ratio:  0.70        1.5       Systemic Diam: 2.00 cm  Buford Dresser MD Electronically signed by Buford Dresser MD Signature Date/Time: 10/14/2019/5:46:44 PM    Final    Cerebral Angio 10/14/2019 S/P revascularization of occluded prox basilar artery with intracranial stentin for underlying severe ICAD. RT radial approach. Bilateral CCA and bilateral VERT artery angiograms.  PHYSICAL EXAM    Patient is intubated and not on sedation.  Not in distress. Afebrile. Head is nontraumatic. Neck is supple without bruit.    Cardiac exam no murmur or gallop. Lungs are clear to auscultation. Distal pulses are well felt. Neurological Exam :  Patient is intubated, off sedation.  Eyes are open, but not quite following commands.  He did not respond to questions with vertical and horizontal eye movements today.  Pupils are equal reactive.  Doll's eye movements are sluggish.  Able to have bilateral gaze horizontally but did not follow commands for upward or downward gaze. Corneal reflexes are sluggish.  He has a weak cough and gag.  He quadriplegic and does not have any voluntary movements in all 4 extremities.  He has mild withdraw to all extremities, BUEs with increased extensor tone and extension posturing following mild withdraw with pain stimulation. BLEs trace withdrawal, left more than right.  Deep tendon reflexes are depressed.  Plantars are equivocal. Sensation, coordination and gait not tested.   ASSESSMENT/PLAN Mr. Gregeory Diangelo is a 86 y.o. male with history of esophageal cancer presenting with L  sided weakness following a fall. Taken to IR for BA occlusion.   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  Code Stroke CT head No acute abnormality. ASPECTS 10.     CTA head & neck proximal BA occlusion and R PCA occlusion. High-grade L P2 and L V4 stenoses.   CT perfusion abnormal level of upper pons  Cerebral angio proximal BA occlusion s/p stent for intracranial atherosclerosis   Post IR CT no evidence of ICH or mass effect or vent prominence  MRI  central pontine infarct. Scattered R cerebellar infarcts. Old R lateral pontine infarct. Small vessel disease.   2D Echo EF 65-70%. No source of embolus     LDL 129    HgbA1c 6.6    P2Y12 = 126  SCDs for VTE prophylaxis   no antithrombotics prior to admission, now on aspirin 81 mg daily and Brilinta (ticagrelor) 90 mg bid.   Therapy recommendations:  pending  Disposition:  pending    DNR now - family would like to re-consider withdraw care when son comes from [place] Monday.   Acute Respiratory Failure  Intubated for IR  Left intubated for airway protection  On propofol  CCM on board  Fever   Tmax 102.3  WBC 7.7->6.6  check UA, CXR, blood culture  Empirical treatment with unasyn  Central fever is in the DDx  Dysphagia . Secondary to stroke . NPO . TF @ 40cc . IVF @ 40cc  Other Stroke Risk Factors  Advanced age  Former smoker, quit 1979  Former smokeless tobacco user  Family hx stroke (father)  Other Active Problems  Esophageal cancer T2 N0 M0 adenocarcinoma 2019 w/ recurrence and T2 lesion not amenable to resection - currently undergoing chemotherapy with FOLFOX and Herceptin at [facility]  Acute blood loss anemia post IR, Hgb 11.9->9.5->8.2   Fe deficiency anemia on po iron  Hx GIB yrs ago  Hospital day # 2  This patient is critically ill and at  significant risk of neurological worsening, death and care requires constant monitoring of vital signs, hemodynamics,respiratory and cardiac monitoring, extensive review of multiple databases, frequent neurological assessment, discussion with family, other specialists and medical decision making of high complexity.I have made any additions or clarifications directly to the above note. I spent 40 minutes of neurocritical care time  in the care of  this patient. Discussed with Dr. Lynetta Mare.    Rosalin Hawking, MD PhD Stroke Neurology 10/16/2019 7:44 PM  To contact Stroke Continuity provider, please refer to [email or web address] After hours, contact General Neurology

## 2019-10-16 NOTE — Progress Notes (Signed)
Pharmacy Antibiotic Note  Larry Beltran is a 86 y.o. male admitted on 10/14/2019 with aspiration pneumonia.  Pharmacy has been consulted for Unasyn dosing.  Plan: Unasyn 3g IV q 6 hrs. F/u renal function, cultures and clinical course.  Height: 5\' 6"  (167.6 cm) Weight: 136 lb 11 oz (62 kg) IBW/kg (Calculated) : 63.8  Temp (24hrs), Avg:99.5 F (37.5 C), Min:98.5 F (36.9 C), Max:102.3 F (39.1 C)  Recent Labs  Lab 10/14/19 0503 10/14/19 0642 10/15/19 0435  WBC 7.7  --  6.6  CREATININE 0.99 0.80 0.95    Estimated Creatinine Clearance: 49.9 mL/min (by C-G formula based on SCr of 0.95 mg/dL).    No Known Allergies   Thank you for allowing pharmacy to be a part of this patient's care.  Marguerite Olea, [facility] Clinical Pharmacist Phone [phone]  10/16/2019 7:53 PM

## 2019-10-16 NOTE — Progress Notes (Signed)
At approximately 0100 pt started having runs of ST up to 130's, followed by heart rates into the upper 50's.

## 2019-10-16 NOTE — Progress Notes (Addendum)
Family would like to wait until possibly Monday to "make a decision" about withdrawing care.  Waiting on son to arrive from [place].  Dr. Erlinda Hong notified.

## 2019-10-17 ENCOUNTER — Inpatient Hospital Stay (HOSPITAL_COMMUNITY): Payer: Medicare Other

## 2019-10-17 DIAGNOSIS — J690 Pneumonitis due to inhalation of food and vomit: Secondary | ICD-10-CM

## 2019-10-17 DIAGNOSIS — D62 Acute posthemorrhagic anemia: Secondary | ICD-10-CM

## 2019-10-17 DIAGNOSIS — J988 Other specified respiratory disorders: Secondary | ICD-10-CM

## 2019-10-17 DIAGNOSIS — Z01818 Encounter for other preprocedural examination: Secondary | ICD-10-CM

## 2019-10-17 LAB — BASIC METABOLIC PANEL
Anion gap: 10 (ref 5–15)
BUN: 25 mg/dL — ABNORMAL HIGH (ref 8–23)
CO2: 19 mmol/L — ABNORMAL LOW (ref 22–32)
Calcium: 8.5 mg/dL — ABNORMAL LOW (ref 8.9–10.3)
Chloride: 110 mmol/L (ref 98–111)
Creatinine, Ser: 0.92 mg/dL (ref 0.61–1.24)
GFR calc Af Amer: >60 mL/min (ref >60)
GFR calc non Af Amer: >60 mL/min (ref >60)
Glucose, Bld: 164 mg/dL — ABNORMAL HIGH (ref 70–99)
Potassium: 3.8 mmol/L (ref 3.5–5.1)
Sodium: 139 mmol/L (ref 135–145)

## 2019-10-17 LAB — GLUCOSE, CAPILLARY
Glucose-Capillary: 138 mg/dL — ABNORMAL HIGH (ref 70–99)
Glucose-Capillary: 143 mg/dL — ABNORMAL HIGH (ref 70–99)

## 2019-10-17 LAB — CBC
HCT: 23.4 % — ABNORMAL LOW (ref 39.0–52.0)
Hemoglobin: 7.8 g/dL — ABNORMAL LOW (ref 13.0–17.0)
MCH: 29.5 pg (ref 26.0–34.0)
MCHC: 33.3 g/dL (ref 30.0–36.0)
MCV: 88.6 fL (ref 80.0–100.0)
Platelets: 117 10*3/uL — ABNORMAL LOW (ref 150–400)
RBC: 2.64 MIL/uL — ABNORMAL LOW (ref 4.22–5.81)
RDW: 15.4 % (ref 11.5–15.5)
WBC: 4.3 10*3/uL (ref 4.0–10.5)
nRBC: 0 % (ref 0.0–0.2)

## 2019-10-17 LAB — PHOSPHORUS: Phosphorus: 2.7 mg/dL (ref 2.5–4.6)

## 2019-10-17 LAB — MAGNESIUM: Magnesium: 1.8 mg/dL (ref 1.7–2.4)

## 2019-10-17 MED ORDER — FERROUS GLUCONATE 324 (38 FE) MG PO TABS
324.0000 mg | ORAL_TABLET | Freq: Two times a day (BID) | ORAL | Status: DC
  Administered 2019-10-18: 11:00:00 324 mg via ORAL
  Filled 2019-10-17 (×2): qty 1

## 2019-10-17 MED ORDER — PANTOPRAZOLE SODIUM 40 MG PO PACK
40.0000 mg | PACK | ORAL | Status: DC
  Administered 2019-10-17 – 2019-10-18 (×2): 40 mg
  Filled 2019-10-17 (×2): qty 20

## 2019-10-17 MED ORDER — FERROUS GLUCONATE 324 (38 FE) MG PO TABS: 324.0000 mg | ORAL_TABLET | Freq: Every morning | ORAL | Status: DC

## 2019-10-17 MED ORDER — ACETAMINOPHEN 325 MG PO TABS
650.0000 mg | ORAL_TABLET | Freq: Four times a day (QID) | ORAL | Status: DC | PRN
  Administered 2019-10-17: 23:00:00 650 mg
  Filled 2019-10-17: qty 2

## 2019-10-17 MED ORDER — ASPIRIN 81 MG PO CHEW
81.0000 mg | CHEWABLE_TABLET | Freq: Every day | ORAL | Status: DC
  Administered 2019-10-18: 11:00:00 81 mg
  Filled 2019-10-17: qty 1

## 2019-10-17 NOTE — Progress Notes (Signed)
STROKE TEAM PROGRESS NOTE   INTERVAL HISTORY Wife and RN are at the bedside.  Pt still intubated.  More lethargic than yesterday, eyes closed but able to open on voice stimulation.  Seems to follow simple command of eye open/close and eye movement, but no other spontaneous movement on request.  Had long discussion with wife at bedside, she would like to have family meeting tomorrow with her son coming from [place] for next step plan.  Vitals:   10/17/19 0700 10/17/19 0714 10/17/19 0800 10/17/19 0900  BP: (!) 123/57 (!) 123/57 127/67 (!) 123/57  Pulse: 88 77 85 72  Resp: (!) 22 16 (!) 21 14  Temp:   99.4 F (37.4 C)   TempSrc:   Axillary   SpO2: 100% 100% 100% 100%  Weight:      Height:        CBC:  Recent Labs  Lab 10/14/19 0503 10/15/19 0435 10/17/19 0629  WBC 7.7 6.6 4.3  NEUTROABS 6.6 5.4  --   HGB 11.9* 8.2* 7.8*  HCT 35.5* 25.9* 23.4*  MCV 88.5 90.9 88.6  PLT 173 140* 117*    Basic Metabolic Panel:  Recent Labs  Lab 10/15/19 0435 10/16/19 1959 10/17/19 0629  NA 133*  --  139  K 3.4*  --  3.8  CL 105  --  110  CO2 18*  --  19*  GLUCOSE 105*  --  164*  BUN 16  --  25*  CREATININE 0.95  --  0.92  CALCIUM 8.0*  --  8.5*  MG 2.0 1.9 1.8  PHOS 3.1 3.6 2.7   Lipid Panel:     Component Value Date/Time   CHOL 198 10/15/2019 0435   TRIG 214 (H) 10/15/2019 0435   TRIG 209 (H) 10/15/2019 0435   HDL 27 (L) 10/15/2019 0435   CHOLHDL 7.3 10/15/2019 0435   VLDL 42 (H) 10/15/2019 0435   LDLCALC 129 (H) 10/15/2019 0435   HgbA1c:  Lab Results  Component Value Date   HGBA1C 6.6 (H) 10/15/2019   Urine Drug Screen: No results found for: LABOPIA, COCAINSCRNUR, LABBENZ, AMPHETMU, THCU, LABBARB  Alcohol Level     Component Value Date/Time   ETH <10 10/14/2019 0503    IMAGING past 48 hours  DG CHEST PORT 1 VIEW  Result Date: 10/17/2019 CLINICAL DATA:  Fever.  Hypoxia. EXAM: PORTABLE CHEST 1 VIEW COMPARISON:  October 14, 2019 FINDINGS: Endotracheal tube tip is  5.5 cm above the carina. Port-A-Cath tip is in the superior vena cava. Nasogastric tube tip and side port are in stomach. No pneumothorax. There is patchy airspace opacity in the right lower lung region. There is mild left base atelectasis. Lungs elsewhere clear. Heart size and pulmonary vascular normal. There is aortic atherosclerosis. No adenopathy. There old healed rib fractures on the left. Bones are osteoporotic. IMPRESSION: Tube and catheter positions as described without pneumothorax. Apparent pneumonia right base. Mild left base atelectasis. Heart size normal. Aortic Atherosclerosis (ICD10-I70.0). Electronically Signed   By: Lowella Grip III M.D.   On: 10/17/2019 09:55     Cerebral Angio 10/14/2019 S/P revascularization of occluded prox basilar artery with intracranial stentin for underlying severe ICAD. RT radial approach. Bilateral CCA and bilateral VERT artery angiograms.  PHYSICAL EXAM    Patient is intubated and on low dose sedation.  Not in distress. Afebrile. Head is nontraumatic. Neck is supple without bruit.    Cardiac exam no murmur or gallop. Lungs are clear to auscultation. Distal pulses are  well felt. Neurological Exam :  Patient is intubated, on low dose propofol.  Eyes are closed but easily open on voice stimulation. He seems able to follow simple commands like "eye open/close", "looking down/left/right". Did not follow other verbal commands. Pupils are equal reactive.  Doll's eye movements are sluggish.  Able to have bilateral gaze horizontally and downward gaze, not following commands with upward gaze. Corneal reflexes are sluggish.  He has a weak cough and gag.  He quadriplegic and does not have any voluntary movements in all 4 extremities.  He has mild withdraw to all extremities, BUEs with increased extensor tone and extension posturing following mild withdraw with pain stimulation. BLEs mild withdrawal, left more than right.  Deep tendon reflexes are depressed.  Plantars are  equivocal. Sensation, coordination and gait not tested.   ASSESSMENT/PLAN [name] is a 86 y.o. male with history of esophageal cancer presenting with L sided weakness after a fall. Taken to IR for BA occlusion.   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  Code Stroke CT head No acute abnormality. ASPECTS 10.     CTA head & neck proximal BA occlusion and R PCA occlusion. High-grade L P2 and L V4 stenoses.   CT perfusion abnormal level of upper pons  Cerebral angio proximal BA occlusion s/p stent for intracranial atherosclerosis   Post IR CT no evidence of ICH or mass effect or vent prominence  MRI  Large central pontine infarct. Scattered R cerebellar infarcts. Old R lateral pontine infarct. Small vessel disease.   2D Echo EF 65-70%. No source of embolus     LDL 129    HgbA1c 6.6    P2Y12 = 126  SCDs for VTE prophylaxis   no antithrombotics prior to admission, now on aspirin 81 mg daily and Brilinta (ticagrelor) 90 mg bid.   Therapy recommendations:  pending     Disposition:  pending    DNR now - family would like to re-consider withdraw care when son comes from [place] Monday.   Acute Respiratory Failure  Intubated for IR  Left intubated for airway protection  On low dose propofol  CCM on board  Fever   Tmax 102.3->99.4  WBC 7.7->6.6->4.3  UA / culture - pending  CXR - 10/17/19 - apparent pneumonia right base  Blood culture - pending  Empirical treatment with Unasyn (started 10/16/19)  Central fever is in the DDx  Acute blood loss anemia post IR  Hgb 11.9->9.5->8.2->7.8   CBC monitoring  stool for occult blood pending  Iron and ferritin pending  Resume home iron pill  Hx GIB yrs ago  Dysphagia . Secondary to stroke . NPO . TF @ 40cc . IVF @ 40cc  Other Stroke Risk Factors   Advanced  age  Former smoker, quit 1979  Former smokeless tobacco user  Family hx stroke (father)  Other Active Problems  Esophageal cancer T2 N0 M0 adenocarcinoma 2019 w/ recurrence and T2 lesion not amenable to resection - currently undergoing chemotherapy with FOLFOX and Herceptin at [facility]  Thrombocytopenia - 173->140->117  Hospital day # 3  This patient is critically ill and at significant risk of neurological worsening, death and care requires constant monitoring of vital signs, hemodynamics,respiratory and cardiac monitoring, extensive review of multiple databases, frequent neurological assessment, discussion with family, other specialists and medical decision making of high complexity.I have made any  additions or clarifications directly to the above note. I spent 35 minutes of neurocritical care time  in the care of  this patient. Discussed with Dr. Lynetta Mare.   Rosalin Hawking, MD PhD Stroke Neurology 10/17/2019 5:01 PM   To contact Stroke Continuity provider, please refer to [email or web address] After hours, contact General Neurology

## 2019-10-17 NOTE — Progress Notes (Signed)
PULMONARY / CRITICAL CARE MEDICINE   NAME:  Larry Beltran, MRN:  [id number], DOB:  Jul 10, 1932, LOS: 3 ADMISSION DATE:  10/14/2019, CONSULTATION DATE: 10/14/2019 REFERRING MD: Interventional radiology, CHIEF COMPLAINT:  ams  BRIEF HISTORY:    86 year old with altered mental status with a basilar artery status post HISTORY OF PRESENT ILLNESS   Is an 86 year old male without past medical history significant for to be esophageal cancer adenocarcinoma for which he has been undergoing chemo radiation therapy at [facility].  Last radiation was in November 2020.  He is currently on chemo with FOLFOX he was in his usual state of health until late evening 10/12/2018 when his wife heard him hit the floor and noted left-sided weakness.  He was transferred to [facility] underwent revascularization of occluded basilar artery with stent placement.  He remains on full mechanical ventilatory support in time of this dictation pulmonary critical care has been asked to manage ventilator.  Currently he is hemodynamically stable with blood pressure cuff pressure 120-161 SIGNIFICANT PAST MEDICAL HISTORY   Esophageal cancer Hypertension  SIGNIFICANT EVENTS:  10/14/2019 revascularization of occluded STUDIES:   CT head with occluded basilar artery  CULTURES:  None  ANTIBIOTICS:  None  LINES/TUBES:  10/14/2019 endotracheal>> 10/14/2019 right radial A-line>> Unknown date right Port-A-Cath in place>>  CONSULTANTS:  10/14/2019 pulmonary critical care SUBJECTIVE:  86 year old male not sedated on full mechanical ventilatory support.  CONSTITUTIONAL: BP (!) 123/57   Pulse 77   Temp 99.4 F (37.4 C) (Oral)   Resp 16   Ht 5\' 6"  (1.676 m)   Wt 62 kg   SpO2 100%   BMI 22.06 kg/m   I/O last 3 completed shifts: In: 1129.9 [I.V.:660.5; NG/GT:269.3; IV Piggyback:200.1] Out: 2165 [Urine:1375; Emesis/NG output:790]     Vent Mode: PRVC FiO2 (%):  [40 %] 40 % Set Rate:  [14 bmp] 14 bmp Vt Set:   [510 mL] 510 mL PEEP:  [5 cmH20] 5 cmH20 Plateau Pressure:  [12 [id number] cmH20] 15 cmH20  PHYSICAL EXAM: General: Acutely ill appearing male, arousable off propofol Neuro: Opens eyes but does not follow commands HEENT: [place]/AT, PERRL, EOM-I and MMM and ETT is in place Cardiovascular: RRR, Nl S1/S2 and -M/R/G Lungs: Coarse diffusely Abdomen: Soft, NT, ND and +BS Musculoskeletal: Grossly intact no focal defect Skin:  Warm and dry  I reviewed CXR myself, no acute disease noted.  Discussed with bedside RN  RESOLVED PROBLEM LIST   ASSESSMENT AND PLAN   Vent dependent respiratory failure secondary to interventional radiology opening occluded basilar artery Place on PS for now until neuro evaluates the patient, extubatable by vent mechanics criteria but would like neuros input Wean FiO2 for sats greater than 92% Adjust vent for ABG VAP prevention  Status post revascularization of occluded proximal basilar artery with intracranial stenting for underlying arterial disease.  Left-sided weakness prior to procedure. Per neurology  Goals of care - family planning to arrive on Monday for compassionate extubation.  SUMMARY OF TODAY'S PLAN:  No escalation of care pending family arrival.  Best Practice / Goals of Care / Disposition.   DVT PROPHYLAXIS: Sequential hose SUP: PPI NUTRITION: N.p.o. MOBILITY: Bedrest GOALS OF CARE: Full code FAMILY DISCUSSIONS: Family updated by neurology DISPOSITION currently in neuro intensive care unit on full mechanical ventilatory support  LABS  Glucose Recent Labs  Lab 10/14/19 0503 10/14/19 0547  GLUCAP 125* 119*    BMET Recent Labs  Lab 10/14/19 0503 10/14/19 [id number] 10/14/19 1230 10/15/19 0435 10/17/19  0629  NA 139 137 139 133* 139  K 4.6 4.2 3.8 3.4* 3.8  CL 103 104  --  105 110  CO2 20*  --   --  18* 19*  BUN 25* 22  --  16 25*  CREATININE 0.99 0.80  --  0.95 0.92  GLUCOSE 129* 117*  --  105* 164*    Liver Enzymes Recent Labs   Lab 10/14/19 0503  AST 37  ALT 33  ALKPHOS 88  BILITOT 0.6  ALBUMIN 4.0    Electrolytes Recent Labs  Lab 10/14/19 0503 10/15/19 0435 10/16/19 1959 10/17/19 0629  CALCIUM 9.4 8.0*  --  8.5*  MG  --  2.0 1.9 1.8  PHOS  --  3.1 3.6 2.7    CBC Recent Labs  Lab 10/14/19 0503 10/14/19 0642 10/14/19 1230 10/15/19 0435  WBC 7.7  --   --  6.6  HGB 11.9* 11.9* 9.5* 8.2*  HCT 35.5* 35.0* 28.0* 25.9*  PLT 173  --   --  140*    ABG Recent Labs  Lab 10/14/19 1230  PHART 7.366  PCO2ART 33.6  PO2ART 103.0    Coag's Recent Labs  Lab 10/14/19 0503  APTT 25  INR 1.1    Sepsis Markers No results for input(s): LATICACIDVEN, PROCALCITON, O2SATVEN in the last 168 hours.  Cardiac Enzymes No results for input(s): TROPONINI, PROBNP in the last 168 hours.  CRITICAL CARE Performed by: Kipp Brood   Total critical care time: 35 minutes  Critical care time was exclusive of separately billable procedures and treating other patients.  Critical care was necessary to treat or prevent imminent or life-threatening deterioration.  Critical care was time spent personally by me on the following activities: development of treatment plan with patient and/or surrogate as well as nursing, discussions with consultants, evaluation of patient's response to treatment, examination of patient, obtaining history from patient or surrogate, ordering and performing treatments and interventions, ordering and review of laboratory studies, ordering and review of radiographic studies, pulse oximetry, re-evaluation of patient's condition and participation in multidisciplinary rounds.  Kipp Brood, MD [facility] ICU Physician [place]  Pager: [phone] Mobile: [phone] After hours: [phone].

## 2019-10-17 NOTE — Progress Notes (Signed)
PULMONARY / CRITICAL CARE MEDICINE   NAME:  Larry Beltran, MRN:  [id number], DOB:  Jun 15, 1932, LOS: 3 ADMISSION DATE:  10/14/2019, CONSULTATION DATE: 10/14/2019 REFERRING MD: Interventional radiology, CHIEF COMPLAINT:  ams  BRIEF HISTORY:    86 year old with altered mental status with a basilar artery status post  HISTORY OF PRESENT ILLNESS   Is an 86 year old male without past medical history significant for to be esophageal cancer adenocarcinoma for which he has been undergoing chemo radiation therapy at [facility].  Last radiation was in November 2020.  He is currently on chemo with FOLFOX he was in his usual state of health until late evening 10/12/2018 when his wife heard him hit the floor and noted left-sided weakness.  He was transferred to [facility] underwent revascularization of occluded basilar artery with stent placement.  He remains on full mechanical ventilatory support in time of this dictation pulmonary critical care has been asked to manage ventilator.  Currently he is hemodynamically stable with blood pressure cuff pressure 120-161  SIGNIFICANT PAST MEDICAL HISTORY   Esophageal cancer Hypertension  SIGNIFICANT EVENTS:  10/14/2019 revascularization of occluded  STUDIES:   CT head with occluded basilar artery  CULTURES:  SARS CoV2 PCR 1/07 >> negative Urine 1/09 >> Blood 1/09 >>   ANTIBIOTICS:  Unasyn 1/09 >>   LINES/TUBES:  10/14/2019 endotracheal>> 10/14/2019 right radial A-line>>  CONSULTANTS:    SUBJECTIVE:  Remains on vent support.  OBJECTIVE:   CONSTITUTIONAL: BP (!) 123/57   Pulse 77   Temp 99.4 F (37.4 C) (Oral)   Resp 16   Ht 5\' 6"  (1.676 m)   Wt 62 kg   SpO2 100%   BMI 22.06 kg/m   I/O last 3 completed shifts: In: 1129.9 [I.V.:660.5; NG/GT:269.3; IV Piggyback:200.1] Out: 2165 [Urine:1375; Emesis/NG output:790]     Vent Mode: PRVC FiO2 (%):  [40 %] 40 % Set Rate:  [14 bmp] 14 bmp Vt Set:  [510 mL] 510 mL PEEP:  [5  cmH20] 5 cmH20 Plateau Pressure:  [12 [id number] cmH20] 15 cmH20  PHYSICAL EXAM:  General - sedated Eyes - pupils reactive ENT - ETT in place Cardiac - regular rate/rhythm, no murmur Chest - scattered rhonchi Abdomen - soft, non tender, + bowel sounds Extremities - no cyanosis, clubbing, or edema Skin - no rashes Neuro - RASS -2   RESOLVED PROBLEM LIST    ASSESSMENT AND PLAN    Acute hypoxic respiratory failure with compromised airway in setting of CVA. 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 - s/p basilar artery stent by IR - per neurology - continue brilinta  Fever from aspiration pneumonia. - unasyn  Hyperlipidemia. - lipitor  Sedation needs. - RASS goal -1  Anemia of critical illness. - f/u CBC intermittently  Best Practice:  DVT PROPHYLAXIS: SCDs SUP: protonix NUTRITION: N.p.o. MOBILITY: Bedrest GOALS OF CARE: DNR DISPOSITION: ICU  LABS   CMP Latest Ref Rng & Units 10/17/2019 10/15/2019 10/14/2019  Glucose 70 - 99 mg/dL 164(H) 105(H) -  BUN 8 - 23 mg/dL 25(H) 16 -  Creatinine 0.61 - 1.24 mg/dL 0.92 0.95 -  Sodium 135 - 145 mmol/L 139 133(L) 139  Potassium 3.5 - 5.1 mmol/L 3.8 3.4(L) 3.8  Chloride 98 - 111 mmol/L 110 105 -  CO2 22 - 32 mmol/L 19(L) 18(L) -  Calcium 8.9 - 10.3 mg/dL 8.5(L) 8.0(L) -  Total  Protein 6.5 - 8.1 g/dL - - -  Total Bilirubin 0.3 - 1.2 mg/dL - - -  Alkaline Phos 38 - 126 U/L - - -  AST 15 - 41 U/L - - -  ALT 0 - 44 U/L - - -    CBC Latest Ref Rng & Units 10/15/2019 10/14/2019 10/14/2019  WBC 4.0 - 10.5 K/uL 6.6 - -  Hemoglobin 13.0 - 17.0 g/dL 8.2(L) 9.5(L) 11.9(L)  Hematocrit 39.0 - 52.0 % 25.9(L) 28.0(L) 35.0(L)  Platelets 150 - 400 K/uL 140(L) - -    ABG    Component Value Date/Time   PHART 7.366 10/14/2019 1230   PCO2ART 33.6 10/14/2019 1230   PO2ART 103.0 10/14/2019 1230   HCO3 19.4 (L)  10/14/2019 1230   TCO2 20 (L) 10/14/2019 1230   ACIDBASEDEF 6.0 (H) 10/14/2019 1230   O2SAT 98.0 10/14/2019 1230    CBG (last 3)  No results for input(s): GLUCAP in the last 72 hours.   CC time 32 minutes  Chesley Mires, MD [facility] Pulmonary/Critical Care 10/17/2019, 7:29 AM

## 2019-10-18 DIAGNOSIS — Z515 Encounter for palliative care: Secondary | ICD-10-CM

## 2019-10-18 DIAGNOSIS — E43 Unspecified severe protein-calorie malnutrition: Secondary | ICD-10-CM

## 2019-10-18 DIAGNOSIS — C159 Malignant neoplasm of esophagus, unspecified: Secondary | ICD-10-CM

## 2019-10-18 LAB — CBC
HCT: 22.5 % — ABNORMAL LOW (ref 39.0–52.0)
Hemoglobin: 7.6 g/dL — ABNORMAL LOW (ref 13.0–17.0)
MCH: 29.9 pg (ref 26.0–34.0)
MCHC: 33.8 g/dL (ref 30.0–36.0)
MCV: 88.6 fL (ref 80.0–100.0)
Platelets: 111 10*3/uL — ABNORMAL LOW (ref 150–400)
RBC: 2.54 MIL/uL — ABNORMAL LOW (ref 4.22–5.81)
RDW: 15.5 % (ref 11.5–15.5)
WBC: 3.4 10*3/uL — ABNORMAL LOW (ref 4.0–10.5)
nRBC: 0 % (ref 0.0–0.2)

## 2019-10-18 LAB — IRON AND TIBC
Iron: 8 ug/dL — ABNORMAL LOW (ref 45–182)
Saturation Ratios: 5 % — ABNORMAL LOW (ref 17.9–39.5)
TIBC: 168 ug/dL — ABNORMAL LOW (ref 250–450)
UIBC: 160 ug/dL

## 2019-10-18 LAB — BASIC METABOLIC PANEL
Anion gap: 9 (ref 5–15)
BUN: 31 mg/dL — ABNORMAL HIGH (ref 8–23)
CO2: 22 mmol/L (ref 22–32)
Calcium: 8.2 mg/dL — ABNORMAL LOW (ref 8.9–10.3)
Chloride: 109 mmol/L (ref 98–111)
Creatinine, Ser: 1.01 mg/dL (ref 0.61–1.24)
GFR calc Af Amer: >60 mL/min (ref >60)
GFR calc non Af Amer: >60 mL/min (ref >60)
Glucose, Bld: 149 mg/dL — ABNORMAL HIGH (ref 70–99)
Potassium: 3.2 mmol/L — ABNORMAL LOW (ref 3.5–5.1)
Sodium: 140 mmol/L (ref 135–145)

## 2019-10-18 LAB — FERRITIN: Ferritin: 164 ng/mL (ref 24–336)

## 2019-10-18 LAB — GLUCOSE, CAPILLARY
Glucose-Capillary: 147 mg/dL — ABNORMAL HIGH (ref 70–99)
Glucose-Capillary: 133 mg/dL — ABNORMAL HIGH (ref 70–99)

## 2019-10-18 LAB — MAGNESIUM: Magnesium: 1.9 mg/dL (ref 1.7–2.4)

## 2019-10-18 LAB — PHOSPHORUS: Phosphorus: 2.3 mg/dL — ABNORMAL LOW (ref 2.5–4.6)

## 2019-10-18 MED ORDER — MORPHINE SULFATE (PF) 2 MG/ML IV SOLN
1.0000 mg | INTRAVENOUS | Status: DC | PRN
  Administered 2019-10-18 – 2019-10-19 (×6): 1 mg via INTRAVENOUS
  Filled 2019-10-18 (×6): qty 1

## 2019-10-18 MED ORDER — HALOPERIDOL LACTATE 2 MG/ML PO CONC
0.5000 mg | ORAL | Status: DC | PRN
  Filled 2019-10-18: qty 0.3

## 2019-10-18 MED ORDER — GLYCOPYRROLATE 0.2 MG/ML IJ SOLN: 0.2000 mg | INTRAMUSCULAR | Status: DC | PRN

## 2019-10-18 MED ORDER — LORAZEPAM 1 MG PO TABS: 1.0000 mg | ORAL_TABLET | ORAL | Status: DC | PRN

## 2019-10-18 MED ORDER — ONDANSETRON HCL 4 MG/2ML IJ SOLN: 4.0000 mg | Freq: Four times a day (QID) | INTRAMUSCULAR | Status: DC | PRN

## 2019-10-18 MED ORDER — LORAZEPAM 2 MG/ML PO CONC: 1.0000 mg | ORAL | Status: DC | PRN

## 2019-10-18 MED ORDER — BIOTENE DRY MOUTH MT LIQD: 15.0000 mL | OROMUCOSAL | Status: DC | PRN

## 2019-10-18 MED ORDER — LORAZEPAM 2 MG/ML IJ SOLN
1.0000 mg | INTRAMUSCULAR | Status: DC | PRN
  Administered 2019-10-18 – 2019-10-19 (×2): 1 mg via INTRAVENOUS
  Filled 2019-10-18 (×2): qty 1

## 2019-10-18 MED ORDER — HALOPERIDOL LACTATE 5 MG/ML IJ SOLN: 0.5000 mg | INTRAMUSCULAR | Status: DC | PRN

## 2019-10-18 MED ORDER — GLYCOPYRROLATE 1 MG PO TABS
1.0000 mg | ORAL_TABLET | ORAL | Status: DC | PRN
  Filled 2019-10-18: qty 1

## 2019-10-18 MED ORDER — ONDANSETRON 4 MG PO TBDP: 4.0000 mg | ORAL_TABLET | Freq: Four times a day (QID) | ORAL | Status: DC | PRN

## 2019-10-18 MED ORDER — SCOPOLAMINE 1 MG/3DAYS TD PT72
1.0000 | MEDICATED_PATCH | TRANSDERMAL | Status: DC
  Administered 2019-10-18: 13:00:00 1.5 mg via TRANSDERMAL
  Filled 2019-10-18: qty 1

## 2019-10-18 MED ORDER — POTASSIUM CHLORIDE 20 MEQ PO PACK: 20.0000 meq | PACK | Freq: Three times a day (TID) | ORAL | Status: DC

## 2019-10-18 MED ORDER — GLYCOPYRROLATE 0.2 MG/ML IJ SOLN
0.2000 mg | INTRAMUSCULAR | Status: DC | PRN
  Administered 2019-10-18 – 2019-10-19 (×4): 0.2 mg via INTRAVENOUS
  Filled 2019-10-18 (×4): qty 1

## 2019-10-18 MED ORDER — HALOPERIDOL 0.5 MG PO TABS
0.5000 mg | ORAL_TABLET | ORAL | Status: DC | PRN
  Filled 2019-10-18: qty 1

## 2019-10-18 MED ORDER — CHLORHEXIDINE GLUCONATE CLOTH 2 % EX PADS
6.0000 | MEDICATED_PAD | Freq: Every day | CUTANEOUS | Status: DC
  Administered 2019-10-19: 10:00:00 6 via TOPICAL

## 2019-10-18 MED ORDER — POLYVINYL ALCOHOL 1.4 % OP SOLN
1.0000 [drp] | Freq: Four times a day (QID) | OPHTHALMIC | Status: DC | PRN
  Filled 2019-10-18: qty 15

## 2019-10-18 NOTE — Progress Notes (Signed)
STROKE TEAM PROGRESS NOTE   INTERVAL HISTORY Pt wife, son and daughter are at bedside for family meeting. Pt lethargic, drowsy but open eyes on voice. Not arm leg movement. I had long discussion with family at bedside, updated pt current condition, reviewed neuro images, treatment plan and poor prognosis, and answered all the questions. Family agreed that pt would not want to live in the situation with no functional meaningful neuro recovery. They requested comfort care measures and then residential hospice.      Vitals:   10/18/19 0500 10/18/19 0530 10/18/19 0600 10/18/19 0630  BP: (!) 109/49 (!) 134/55 (!) 139/51 (!) 124/50  Pulse: 69 88 (!) 107 87  Resp: 14 (!) 22 12 18   Temp:    99.1 F (37.3 C)  TempSrc:    Oral  SpO2: 100% 100% 100% 100%  Weight:      Height:        CBC:  Recent Labs  Lab 10/14/19 0503 10/15/19 0435 10/17/19 0629 10/18/19 0432  WBC 7.7 6.6 4.3 3.4*  NEUTROABS 6.6 5.4  --   --   HGB 11.9* 8.2* 7.8* 7.6*  HCT 35.5* 25.9* 23.4* 22.5*  MCV 88.5 90.9 88.6 88.6  PLT 173 140* 117* 111*    Basic Metabolic Panel:  Recent Labs  Lab 10/17/19 0629 10/18/19 0432  NA 139 140  K 3.8 3.2*  CL 110 109  CO2 19* 22  GLUCOSE 164* 149*  BUN 25* 31*  CREATININE 0.92 1.01  CALCIUM 8.5* 8.2*  MG 1.8 1.9  PHOS 2.7 2.3*   Lipid Panel:     Component Value Date/Time   CHOL 198 10/15/2019 0435   TRIG 214 (H) 10/15/2019 0435   TRIG 209 (H) 10/15/2019 0435   HDL 27 (L) 10/15/2019 0435   CHOLHDL 7.3 10/15/2019 0435   VLDL 42 (H) 10/15/2019 0435   LDLCALC 129 (H) 10/15/2019 0435   HgbA1c:  Lab Results  Component Value Date   HGBA1C 6.6 (H) 10/15/2019   Urine Drug Screen: No results found for: LABOPIA, COCAINSCRNUR, LABBENZ, AMPHETMU, THCU, LABBARB  Alcohol Level     Component Value Date/Time   ETH <10 10/14/2019 0503    IMAGING past 48 hours  DG CHEST PORT 1 VIEW  Result Date: 10/17/2019 CLINICAL DATA:  Fever.  Hypoxia. EXAM: PORTABLE CHEST 1 VIEW  COMPARISON:  October 14, 2019 FINDINGS: Endotracheal tube tip is 5.5 cm above the carina. Port-A-Cath tip is in the superior vena cava. Nasogastric tube tip and side port are in stomach. No pneumothorax. There is patchy airspace opacity in the right lower lung region. There is mild left base atelectasis. Lungs elsewhere clear. Heart size and pulmonary vascular normal. There is aortic atherosclerosis. No adenopathy. There old healed rib fractures on the left. Bones are osteoporotic. IMPRESSION: Tube and catheter positions as described without pneumothorax. Apparent pneumonia right base. Mild left base atelectasis. Heart size normal. Aortic Atherosclerosis (ICD10-I70.0). Electronically Signed   By: Lowella Grip III M.D.   On: 10/17/2019 09:55    Cerebral Angio 10/14/2019 S/P revascularization of occluded prox basilar artery with intracranial stentin for underlying severe ICAD. RT radial approach. Bilateral CCA and bilateral VERT artery angiograms.   PHYSICAL EXAM  Patient is intubated off sedation.  Not in distress. Afebrile. Head is nontraumatic. Neck is supple without bruit.    Cardiac exam no murmur or gallop. Lungs are clear to auscultation. Distal pulses are well felt. Neurological Exam :  Patient is intubated, off propofol.  Eyes  are closed but easily open on voice stimulation. He seems able to follow simple commands like "eye open/close", "looking down/left/right". Did not follow other verbal commands. Pupils are equal reactive.  Doll's eye movements are sluggish.  Able to have bilateral gaze horizontally and downward gaze, not following commands with upward gaze. Corneal reflexes are sluggish.  He has a weak cough and gag.  He quadriplegic and does not have any voluntary movements in all 4 extremities.  He has mild withdraw to all extremities, BUEs with increased extensor tone and extension posturing following mild withdraw with pain stimulation. BLEs mild withdrawal, left more than right.  Deep  tendon reflexes are depressed.  Plantars are equivocal. Sensation, coordination and gait not tested.   ASSESSMENT/PLAN Mr. Kellyn Rudy is a 87 y.o. male with history of esophageal cancer presenting with L sided weakness after a fall. Taken to IR for BA occlusion.   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  Code Stroke CT head No acute abnormality. ASPECTS 10.     CTA head & neck proximal BA occlusion and R PCA occlusion. High-grade L P2 and L V4 stenoses.   CT perfusion abnormal level of upper pons  Cerebral angio proximal BA occlusion s/p stent for intracranial atherosclerosis   Post IR CT no evidence of ICH or mass effect or vent prominence  MRI  Large central pontine infarct. Scattered R cerebellar infarcts. Old R lateral pontine infarct. Small vessel disease.   2D Echo EF 65-70%. No source of embolus     LDL 129    HgbA1c 6.6    P2Y12 = 126  SCDs for VTE prophylaxis   no antithrombotics prior to admission,was on aspirin 81 mg daily and Brilinta (ticagrelor) 90 mg bid. Will transition to comfort care measures.  DNR now - family requested comfort care measures after family meeting   Acute Respiratory Failure  Intubated for IR  Left intubated for airway protection, will be extubated for comfort care measures  Off propofol  CCM on board for timing of extubation  Aspiration PNA  Tmax 102.3->99.4->101.2  WBC 7.7->6.6->4.3->3.4  UA / culture - pending   CXR - 10/17/19 - apparent pneumonia right base  Blood culture - no growth 1 d   Empirical treatment with Unasyn 10/16/19>>d/c for comfort care measures  Central fever is in the DDx  Acute blood loss anemia post IR  Hgb 11.9->9.5->8.2->7.8->7.6   CBC monitoring  stool for occult blood pending    Iron 8, TIBC 168, Sat 5% and ferritin NML  Hx GIB yrs  ago  Dysphagia . Secondary to stroke . NPO . Off IVF and TF for comfort care measures  Other Stroke Risk Factors   Advanced age  Former smoker, quit 1979  Former smokeless tobacco user  Family hx stroke (father)  Other Active Problems  Esophageal cancer T2 N0 M0 adenocarcinoma 2019 w/ recurrence and T2 lesion not amenable to resection - currently undergoing chemotherapy with FOLFOX and Her[facility] at [facility]  Thrombocytopenia - 173->140->->117->111  Hypokalemia 3.8->3.2   Hospital day # 4  This patient is critically ill and at significant risk of neurological worsening, death and care requires constant monitoring of vital signs, hemodynamics,respiratory and cardiac monitoring, extensive review of multiple databases, frequent neurological assessment, discussion with family, other specialists and medical decision making of high complexity. I had long discussion with family at bedside, updated  pt current condition, reviewed neuro images, treatment plan and poor prognosis, and answered all the questions. Family agreed that pt would not want to live in the situation with no functional meaningful neuro recovery. They requested comfort care measures and then residential hospice.    Rosalin Hawking, MD PhD Stroke Neurology 10/18/2019 9:20 AM   To contact Stroke Continuity provider, please refer to [email or web address] After hours, contact General Neurology

## 2019-10-18 NOTE — Progress Notes (Signed)
The chaplain visited with the patient after a referral from the nurse concerning the plan of care for the patient.  The wife of the patient asked a question of the chaplain, "Morally, are we doing the right thing?"  The chaplain offered spiritual support concerning the decision concerning hospice care and what quality of life means.  The chaplain led a conversation with the family and encouraged them to walk confidently in what they choose.  The chaplain is available for follow-up if necessary.  Brion Aliment Chaplain Resident For questions concerning this note please contact me by pager [phone]

## 2019-10-18 NOTE — Social Work (Signed)
CSW acknowledging consult for comfort care. Will follow for disposition should hospice services or residential hospice become appropriate.   Alexander Mt, [place] Work

## 2019-10-18 NOTE — Progress Notes (Signed)
PULMONARY / CRITICAL CARE MEDICINE   NAME:  Larry Beltran, MRN:  [id number], DOB:  02-Jun-1932, LOS: 4 ADMISSION DATE:  10/14/2019, CONSULTATION DATE: 10/14/2019 REFERRING MD: Interventional radiology, CHIEF COMPLAINT:  ams  BRIEF HISTORY:    86 year old with altered mental status with a basilar artery status post  HISTORY OF PRESENT ILLNESS   Is an 86 year old male without past medical history significant for to be esophageal cancer adenocarcinoma for which he has been undergoing chemo radiation therapy at [facility].  Last radiation was in November 2020.  He is currently on chemo with FOLFOX he was in his usual state of health until late evening 10/12/2018 when his wife heard him hit the floor and noted left-sided weakness.  He was transferred to [facility] underwent revascularization of occluded basilar artery with stent placement.  He remains on full mechanical ventilatory support in time of this dictation pulmonary critical care has been asked to manage ventilator.  Currently he is hemodynamically stable with blood pressure cuff pressure 120-161  SIGNIFICANT PAST MEDICAL HISTORY   Esophageal cancer Hypertension  SIGNIFICANT EVENTS:  10/14/2019 revascularization of occluded  STUDIES:   CT head with occluded basilar artery  CULTURES:  SARS CoV2 PCR 1/07 >> negative Urine 1/09 >> Blood 1/09 >>   ANTIBIOTICS:  Unasyn 1/09 >>   LINES/TUBES:  10/14/2019 endotracheal>> 10/14/2019 right radial A-line>>  CONSULTANTS:    SUBJECTIVE:  No events overnight Weaning but mental status prohibits extubation  OBJECTIVE:   CONSTITUTIONAL: BP (!) 124/50   Pulse 87   Temp 99.1 F (37.3 C) (Oral)   Resp 18   Ht 5\' 6"  (1.676 m)   Wt 62 kg   SpO2 100%   BMI 22.06 kg/m   I/O last 3 completed shifts: In: 3193.8 [I.V.:1284.5; NG/GT:1309.3; IV Piggyback:599.9] Out: 1000 [Urine:910; Emesis/NG output:90]     Vent Mode: PRVC FiO2 (%):  [40 %] 40 % Set Rate:  [14 bmp] 14  bmp Vt Set:  [510 mL] 510 mL PEEP:  [5 cmH20] 5 cmH20 Plateau Pressure:  [3 cmH20-15 cmH20] 15 cmH20  PHYSICAL EXAM:  General - Chronically ill appearing elderly male,  HEENT - [place]/AT, PERRL, EOM-I and MMM, ETT in place Cardiac - RRR, Nl S1/S2 and -M/R/G Chest - CTA bilaterally Abdomen - Soft, NT, ND and +BS Extremities - no cyanosis, clubbing, or edema Skin - Intact Neuro - Arousable but not following commands  I reviewed CXR myself, ETT is in a good position  Discussed with bedside RN  RESOLVED PROBLEM LIST    ASSESSMENT AND PLAN    Acute hypoxic respiratory failure with compromised airway in setting of CVA. 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 - S/p basilar artery stent by IR - Per neurology - Sedation for comfort at this point  Fever from aspiration pneumonia. - Unasyn  Hyperlipidemia. - Lipitor  Sedation needs. - RASS goal -1  Anemia of critical illness. - F/u CBC intermittently  Family to arrive today for discussion of transition to comfort  Best Practice:  DVT PROPHYLAXIS: SCDs SUP: protonix NUTRITION: N.p.o. MOBILITY: Bedrest GOALS OF CARE: DNR DISPOSITION: ICU  LABS   CMP Latest Ref Rng & Units 10/18/2019 10/17/2019 10/15/2019  Glucose 70 - 99 mg/dL 149(H) 164(H) 105(H)  BUN 8 - 23 mg/dL 31(H)  25(H) 16  Creatinine 0.61 - 1.24 mg/dL 1.01 0.92 0.95  Sodium 135 - 145 mmol/L 140 139 133(L)  Potassium 3.5 - 5.1 mmol/L 3.2(L) 3.8 3.4(L)  Chloride 98 - 111 mmol/L 109 110 105  CO2 22 - 32 mmol/L 22 19(L) 18(L)  Calcium 8.9 - 10.3 mg/dL 8.2(L) 8.5(L) 8.0(L)  Total Protein 6.5 - 8.1 g/dL - - -  Total Bilirubin 0.3 - 1.2 mg/dL - - -  Alkaline Phos 38 - 126 U/L - - -  AST 15 - 41 U/L - - -  ALT 0 - 44 U/L - - -    CBC Latest Ref Rng & Units 10/18/2019  10/17/2019 10/15/2019  WBC 4.0 - 10.5 K/uL 3.4(L) 4.3 6.6  Hemoglobin 13.0 - 17.0 g/dL 7.6(L) 7.8(L) 8.2(L)  Hematocrit 39.0 - 52.0 % 22.5(L) 23.4(L) 25.9(L)  Platelets 150 - 400 K/uL 111(L) 117(L) 140(L)    ABG    Component Value Date/Time   PHART 7.366 10/14/2019 1230   PCO2ART 33.6 10/14/2019 1230   PO2ART 103.0 10/14/2019 1230   HCO3 19.4 (L) 10/14/2019 1230   TCO2 20 (L) 10/14/2019 1230   ACIDBASEDEF 6.0 (H) 10/14/2019 1230   O2SAT 98.0 10/14/2019 1230    CBG (last 3)  Recent Labs    10/17/19 1945 10/17/19 2318 10/18/19 0342  GLUCAP 138* 143* 147*   The patient is critically ill with multiple organ systems failure and requires high complexity decision making for assessment and support, frequent evaluation and titration of therapies, application of advanced monitoring technologies and extensive interpretation of multiple databases.   Critical Care Time devoted to patient care services described in this note is  31  Minutes. This time reflects time of care of this signee Dr Jennet Maduro. This critical care time does not reflect procedure time, or teaching time or supervisory time of PA/NP/Med student/Med Resident etc but could involve care discussion time.  Rush Farmer, M.D. [facility] Pulmonary/Critical Care Medicine.

## 2019-10-18 NOTE — Progress Notes (Signed)
Nutrition Brief Note  Chart reviewed. Pt now transitioning to comfort care.  No further nutrition interventions warranted at this time.  Please re-consult as needed.   Keera Altidor RD, LDN, CNSC [phone] Pager [phone] After Hours Pager    

## 2019-10-19 ENCOUNTER — Encounter (HOSPITAL_COMMUNITY): Payer: Self-pay

## 2019-10-19 DIAGNOSIS — D62 Acute posthemorrhagic anemia: Secondary | ICD-10-CM | POA: Diagnosis not present

## 2019-10-19 DIAGNOSIS — J690 Pneumonitis due to inhalation of food and vomit: Secondary | ICD-10-CM | POA: Diagnosis present

## 2019-10-19 DIAGNOSIS — C159 Malignant neoplasm of esophagus, unspecified: Secondary | ICD-10-CM | POA: Diagnosis present

## 2019-10-19 DIAGNOSIS — I69391 Dysphagia following cerebral infarction: Secondary | ICD-10-CM

## 2019-10-19 DIAGNOSIS — D696 Thrombocytopenia, unspecified: Secondary | ICD-10-CM | POA: Diagnosis present

## 2019-10-19 DIAGNOSIS — E876 Hypokalemia: Secondary | ICD-10-CM | POA: Diagnosis not present

## 2019-10-19 DIAGNOSIS — G835 Locked-in state: Secondary | ICD-10-CM | POA: Diagnosis present

## 2019-10-19 DIAGNOSIS — J9600 Acute respiratory failure, unspecified whether with hypoxia or hypercapnia: Secondary | ICD-10-CM | POA: Diagnosis not present

## 2019-10-19 MED ORDER — GLYCOPYRROLATE 0.2 MG/ML IJ SOLN: 0.2000 mg | INTRAMUSCULAR | Status: AC | PRN

## 2019-10-19 MED ORDER — ONDANSETRON 4 MG PO TBDP: 4.0000 mg | ORAL_TABLET | Freq: Four times a day (QID) | ORAL | 0 refills | Status: AC | PRN

## 2019-10-19 MED ORDER — ACETAMINOPHEN 650 MG RE SUPP: 650.0000 mg | RECTAL | 0 refills | Status: AC | PRN

## 2019-10-19 MED ORDER — HALOPERIDOL LACTATE 2 MG/ML PO CONC: 0.5000 mg | ORAL | 0 refills | Status: AC | PRN

## 2019-10-19 MED ORDER — SCOPOLAMINE 1 MG/3DAYS TD PT72: 1.0000 | MEDICATED_PATCH | TRANSDERMAL | 12 refills | Status: AC

## 2019-10-19 MED ORDER — LORAZEPAM 2 MG/ML PO CONC: 1.0000 mg | ORAL | 0 refills | Status: AC | PRN

## 2019-10-19 NOTE — Discharge Summary (Addendum)
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[place])   Acute respiratory failure ([place])   Aspiration pneumonia ([place])   Acute blood loss anemia   Dysphagia due to recent cerebrovascular accident   Esophageal cancer ([place])   Thrombocytopenia ([place])   Hypokalemia   Allergies as of 10/19/2019   No Known Allergies     Medication List    STOP taking these medications   eucerin cream   ferrous gluconate 324 MG tablet Commonly known as: FERGON   loperamide 2 MG capsule Commonly known as: IMODIUM   meclizine 25 MG tablet Commonly known as: ANTIVERT   ondansetron 8 MG tablet Commonly known as: ZOFRAN   PRESCRIPTION MEDICATION   prochlorperazine 5 MG tablet Commonly known as: COMPAZINE   sucralfate 1 g tablet Commonly known as: CARAFATE     TAKE these medications   acetaminophen 650 MG suppository Commonly known as: TYLENOL Place 1 suppository (650 mg total) rectally every 4 (four) hours as needed for mild pain (or temp > 37.5 C (99.5 F)).   glycopyrrolate 0.2 MG/ML injection Commonly known as: ROBINUL Inject 1 mL (0.2 mg total) into the skin every 4 (four) hours as needed (excessive secretions).   haloperidol 2 MG/ML solution Commonly known as: HALDOL Place 0.3 mLs (0.6 mg total) under the tongue every 4 (four) hours as needed for agitation (or delirium).   LORazepam 2 MG/ML concentrated solution Commonly known as: ATIVAN Place 0.5 mLs (1 mg total) under the tongue every 4  (four) hours as needed for anxiety.   ondansetron 4 MG disintegrating tablet Commonly known as: ZOFRAN-ODT Take 1 tablet (4 mg total) by mouth every 6 (six) hours as needed for nausea.   scopolamine 1 MG/3DAYS Commonly known as: TRANSDERM-SCOP Place 1 patch (1.5 mg total) onto the skin every 3 (three) days. Start taking on: October 21, 2019       LABORATORY STUDIES CBC    Component Value Date/Time   WBC 3.4 (L) 10/18/2019 0432   RBC 2.54 (L) 10/18/2019 0432   HGB 7.6 (L) 10/18/2019 0432   HCT 22.5 (L) 10/18/2019 0432   PLT 111 (L) 10/18/2019 0432   MCV 88.6 10/18/2019 0432   MCH 29.9 10/18/2019 0432   MCHC 33.8 10/18/2019 0432   RDW 15.5 10/18/2019 0432   LYMPHSABS 0.2 (L) 10/15/2019 0435   MONOABS 0.8 10/15/2019 0435   EOSABS 0.1 10/15/2019 0435   BASOSABS 0.0 10/15/2019 0435   CMP    Component Value Date/Time   NA 140 10/18/2019 0432   K 3.2 (L) 10/18/2019 0432   CL 109 10/18/2019 0432   CO2 22 10/18/2019 0432   GLUCOSE 149 (H) 10/18/2019 0432   BUN 31 (H) 10/18/2019 0432   CREATININE 1.01 10/18/2019 0432   CALCIUM 8.2 (L) 10/18/2019 0432   PROT 6.8 10/14/2019 0503   ALBUMIN 4.0 10/14/2019 0503   AST 37 10/14/2019 0503   ALT 33 10/14/2019 0503   ALKPHOS 88 10/14/2019 0503  BILITOT 0.6 10/14/2019 0503   GFRNONAA >60 10/18/2019 0432   GFRAA >60 10/18/2019 0432   COAGS Lab Results  Component Value Date   INR 1.1 10/14/2019   Lipid Panel    Component Value Date/Time   CHOL 198 10/15/2019 0435   TRIG 214 (H) 10/15/2019 0435   TRIG 209 (H) 10/15/2019 0435   HDL 27 (L) 10/15/2019 0435   CHOLHDL 7.3 10/15/2019 0435   VLDL 42 (H) 10/15/2019 0435   LDLCALC 129 (H) 10/15/2019 0435   HgbA1C  Lab Results  Component Value Date   HGBA1C 6.6 (H) 10/15/2019   Alcohol Level    Component Value Date/Time   ETH <10 10/14/2019 0503    SIGNIFICANT DIAGNOSTIC STUDIES CT Code Stroke CTA Head W/WO contrast  Result Date: 10/14/2019 CLINICAL DATA:  Slurred  speech and left-sided weakness EXAM: CT ANGIOGRAPHY HEAD AND NECK CT PERFUSION BRAIN TECHNIQUE: Multidetector CT imaging of the head and neck was performed using the standard protocol during bolus administration of intravenous contrast. Multiplanar CT image reconstructions and MIPs were obtained to evaluate the vascular anatomy. Carotid stenosis measurements (when applicable) are obtained utilizing NASCET criteria, using the distal internal carotid diameter as the denominator. Multiphase CT imaging of the brain was performed following IV bolus contrast injection. Subsequent parametric perfusion maps were calculated using RAPID software. CONTRAST:  Dose is currently not available COMPARISON:  Noncontrast head CT earlier today FINDINGS: CTA NECK FINDINGS Aortic arch: Atherosclerotic plaque that is extensive. Four vessel branching. Right carotid system: Relatively mild atherosclerotic plaque at the bifurcation without flow limiting stenosis. No ulceration. Left carotid system: Partial retropharyngeal course. Relatively mild atherosclerotic calcification mainly at the bifurcation without flow limiting stenosis or ulceration. Vertebral arteries: Left vertebral artery arises from the arch. Moderate atheromatous type narrowing at the right V1 segment. Both vertebral arteries are patent at the level of the dural penetration. Skeleton: Spinal degeneration and spondylosis. No acute or aggressive finding Other neck: Port in place. Upper chest: Distended esophagus containing semi solid appearing material were covered. Review of the MIP images confirms the above findings CTA HEAD FINDINGS Anterior circulation: Atherosclerotic plaque along the carotid siphons. No branch occlusion, beading, or aneurysm. Posterior circulation: Dominant right vertebral artery. There is high-grade narrowing of the left V4 segment. Although the basilar is likely hypoplastic in the setting of fetal type left PCA, there is superimposed thready flow  followed by occlusion with no flow seen at the basilar tip or throughout the large majority of the right PCA. High-grade left P2 segment stenosis. Venous sinuses: Patent Anatomic variants: As above Review of the MIP images confirms the above findings CT Brain Perfusion Findings: ASPECTS: 10 CBF (<30%) Volume: 63m Perfusion (Tmax>6.0s) volume: 441mat the level of the pons Critical Value/emergent results were called by telephone at the time of interpretation on 10/14/2019 at 5:28 am to [place] who verbally acknowledged these results. IMPRESSION: 1. Occluded mid to distal basilar and right PCA. There is correlative abnormal perfusion at the level of the upper pons. 2. High-grade left P2 and left V4 segment stenoses. 3. No flow limiting stenosis or large vessel occlusion in the anterior circulation. 4. Distended upper thoracic esophagus likely containing debris. Electronically Signed   By: JoMonte Fantasia.D.   On: 10/14/2019 05:32   CT Code Stroke CTA Neck W/WO contrast  Result Date: 10/14/2019 CLINICAL DATA:  Slurred speech and left-sided weakness EXAM: CT ANGIOGRAPHY HEAD AND NECK CT PERFUSION BRAIN TECHNIQUE: Multidetector CT imaging of the head and  neck was performed using the standard protocol during bolus administration of intravenous contrast. Multiplanar CT image reconstructions and MIPs were obtained to evaluate the vascular anatomy. Carotid stenosis measurements (when applicable) are obtained utilizing NASCET criteria, using the distal internal carotid diameter as the denominator. Multiphase CT imaging of the brain was performed following IV bolus contrast injection. Subsequent parametric perfusion maps were calculated using RAPID software. CONTRAST:  Dose is currently not available COMPARISON:  Noncontrast head CT earlier today FINDINGS: CTA NECK FINDINGS Aortic arch: Atherosclerotic plaque that is extensive. Four vessel branching. Right carotid system: Relatively mild atherosclerotic  plaque at the bifurcation without flow limiting stenosis. No ulceration. Left carotid system: Partial retropharyngeal course. Relatively mild atherosclerotic calcification mainly at the bifurcation without flow limiting stenosis or ulceration. Vertebral arteries: Left vertebral artery arises from the arch. Moderate atheromatous type narrowing at the right V1 segment. Both vertebral arteries are patent at the level of the dural penetration. Skeleton: Spinal degeneration and spondylosis. No acute or aggressive finding Other neck: Port in place. Upper chest: Distended esophagus containing semi solid appearing material were covered. Review of the MIP images confirms the above findings CTA HEAD FINDINGS Anterior circulation: Atherosclerotic plaque along the carotid siphons. No branch occlusion, beading, or aneurysm. Posterior circulation: Dominant right vertebral artery. There is high-grade narrowing of the left V4 segment. Although the basilar is likely hypoplastic in the setting of fetal type left PCA, there is superimposed thready flow followed by occlusion with no flow seen at the basilar tip or throughout the large majority of the right PCA. High-grade left P2 segment stenosis. Venous sinuses: Patent Anatomic variants: As above Review of the MIP images confirms the above findings CT Brain Perfusion Findings: ASPECTS: 10 CBF (<30%) Volume: 66m Perfusion (Tmax>6.0s) volume: 475mat the level of the pons Critical Value/emergent results were called by telephone at the time of interpretation on 10/14/2019 at 5:28 am to [place] who verbally acknowledged these results. IMPRESSION: 1. Occluded mid to distal basilar and right PCA. There is correlative abnormal perfusion at the level of the upper pons. 2. High-grade left P2 and left V4 segment stenoses. 3. No flow limiting stenosis or large vessel occlusion in the anterior circulation. 4. Distended upper thoracic esophagus likely containing debris.  Electronically Signed   By: JoMonte Fantasia.D.   On: 10/14/2019 05:32   MR BRAIN WO CONTRAST  Result Date: 10/14/2019 CLINICAL DATA:  Slurred speech and left-sided weakness. Follow-up basilar and right PCA occlusion EXAM: MRI HEAD WITHOUT CONTRAST TECHNIQUE: Multiplanar, multiecho pulse sequences of the brain and surrounding structures were obtained without intravenous contrast. COMPARISON:  CT studies earlier today FINDINGS: Brain: Diffusion imaging shows a 2 cm region of acute infarction affecting the pons. Scattered small acute infarctions are present within the right cerebellar hemisphere inferior to superior. No acute infarction in the left cerebellar hemisphere. No supratentorial infarction. Specifically, thalami and occipital lobes are intact. No evidence of hemorrhage in the regions of acute infarction. There is an old infarction in the right lateral pons. Cerebral hemispheres show mild chronic small-vessel change of the white matter considering age. No hydrocephalus or extra-axial collection. Vascular: Possible slow flow in the vertebrobasilar system. Skull and upper cervical spine: Negative Sinuses/Orbits: Mucosal thickening of the paranasal sinuses. Fluid in the nasopharynx often seen with intubation. Other: None IMPRESSION: 2 cm acute infarction affecting the central pons. No visible hemorrhage. Scattered small infarctions within the right cerebellum inferior to superior. Old infarction right lateral pons. Mild chronic small-vessel ischemic change  of the cerebral hemispheric white matter. Electronically Signed   By: Mulligan Chimes M.D.   On: 10/14/2019 15:44   IR CT Head Ltd  Result Date: 10/14/2019 INDICATION: Gradual decline in mentation with fall and left-sided weakness and severe dysarthria. 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  EXAM: 1. EMERGENT LARGE VESSEL OCCLUSION THROMBOLYSIS (POSTERIOR CIRCULATION)  COMPARISON:  CT angiogram of the head and neck of October 14, 2019.  MEDICATIONS: Ancef 2 g IV antibiotic was administered within 1 hour of the procedure.  ANESTHESIA/SEDATION: General anesthesia.  CONTRAST:  Isovue 300 approximately 150 mL.  FLUOROSCOPY TIME:  Fluoroscopy Time: 117 minutes 12 seconds (3624 mGy).  COMPLICATIONS: None immediate.  TECHNIQUE: Following a full explanation of the procedure along with the potential associated complications, an informed witnessed consent was obtained from the patient's spouse. The risks of intracranial hemorrhage of 10%, worsening neurological deficit, ventilator dependency, death and inability to revascularize were all reviewed in detail with the patient's spouse.  The patient was then put under general anesthesia by the Department of Anesthesiology at [facility].  The right groin was prepped and draped in the usual sterile fashion. Thereafter using modified Seldinger technique, transfemoral access into the right common femoral artery was obtained without difficulty. Over a 0.035 inch guidewire a 5 French Pinnacle sheath was inserted. Through this, and also over a 0.035 inch guidewire a 5 [place] JB 1 catheter was advanced to the aortic arch region and selectively positioned in the innominate artery, the right vertebral artery, the right common carotid artery and the left vertebral artery.  The right radial artery was identified and evaluated with ultrasound and documented. Using ultrasound guidance, right radial access was then obtained using a micropuncture set. Over a 0.018 inch micro guidewire, a 6/7 radial sheath was then inserted without difficulty. The obturator, and the micro guidewire were removed. Good aspiration was obtained from the side port of the sheath. A cocktail of 2000 units of heparin, 2.5 mg of verapamil, and 200 mcg of nitroglycerin was then infused in diluted form without event.  Over a 0.035 Roadrunner guidewire, a Benchmark 95 cm 6 [place] guide catheter was then advanced and a 5 [place]  catheter, and advanced into the proximal right vertebral artery.  The 5 [place] diagnostic catheter were retrieved and removed. The Benchmark 6 [place] guide catheter was then advanced into the proximal 1/3 of the right vertebral artery.  The guidewire was removed. Good aspiration obtained from the hub of the Benchmark guide catheter. A control arteriogram performed through Benchmark micro guide catheter demonstrated antegrade flow to the distal right vertebral artery.  FINDINGS: The innominate arteriogram demonstrates the origins of the right subclavian artery and the right common carotid artery to be widely patent.  The right vertebral artery origin demonstrates mild stenosis.  The vessel is seen to opacify to the cranial skull base.  Wide patency is seen of the right vertebrobasilar junction proximal to the right posterior-inferior cerebellar artery.  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  A string sign is demonstrated in the occluded basilar artery proximally. The right common carotid arteriogram demonstrates the right external carotid artery and its major branches to be widely patent. The right internal carotid artery at the bulb to the cranial skull base demonstrates wide patency.  Wide patency is  seen of the petrous, the cavernous and the supraclinoid segments.  Atherosclerotic narrowing is seen of the cavernous segment of the right internal carotid artery and the proximal carpal cavernous segment and the distal cavernous segment. The supraclinoid right ICA is widely patent.  The right middle and the right anterior cerebral arteries opacify into the capillary and venous phases. The left vertebral artery origin is from the aortic arch between the origin of the left subclavian artery and the left common  carotid artery.  The vessel is seen to opacify to the cranial skull base where wide patency is seen of the left posterior-inferior cerebellar artery. 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  PROCEDURE: The diagnostic JB 1 catheter initially in the distal right subclavian artery was then exchanged over a 0.035 inch 300 cm Rosen exchange guidewire for a 5 [place] Pinnacle sheath in the right groin which was connected to continuous heparinized saline infusion.  An 8 French 80 cm Neuron Max sheath was then positioned just proximal to the origin of the right vertebral artery followed by the removal of the exchange guidewire.  Over a 0.035 inch Roadrunner guidewire, a 6 French 132 cm Catalyst guide catheter was then advanced to the distal end of the Neuron Max sheath. The guidewire was then advanced into the proximal 1/3 of the right vertebral artery followed by the Catalyst guide catheter in the proximal 1/3.  However, more distal advancement of the system was met with significant resistance on account of the tortuosity of the aortic arch with herniation into the aortic arch. Further attempts were then made using a Benchmark 6 [place] guide catheter positioned in the proximal right vertebral artery.  An angiogram obtained through this demonstrated multiple focal areas of irregularity with mild-to-moderate narrowing persisting into the distal right vertebral artery at the cranial skull base.  A [place] 027 microcatheter was then advanced through the [facility] guide catheter over a 0.014 inch standard Synchro micro guidewire and then over a 0.016 inch Fathom micro guidewire. The microcatheter could be advanced to the level of the cranial skull base.  However, further advancements again were met with significant proximal herniation of the platform into the aortic arch region. After multiple attempts, the femoral  approach was then abandoned.  The right radial artery was then accessed using the 6 French 95 cm Benchmark guide catheter which was positioned in the proximal 1/3 of the right vertebral artery as described above.  Over a 0.014 inch standard Synchro micro guidewire, an 021 Trevo ProVue microcatheter was then advanced using biplane roadmap technique and constant fluoroscopic guidance to the right vertebrobasilar junction.  Access through the occluded basilar artery was then obtained gradually, slowly and deliberately until the micro guidewire was advanced without difficulty into the right posterior cerebral artery P1 P2 junction followed by the microcatheter.  The guidewire was removed. Good aspiration was obtained from the hub of the microcatheter. A gentle control arteriogram performed through the microcatheter demonstrated safe position of tip of the microcatheter.  A 3 mm x 33 mm Trevo ProVue retrieval device was then advanced to the distal end of the microcatheter.  The O ring on the delivery microcatheter was loosened. With slight forward gentle traction with the right hand on the delivery micro guidewire the retrieval device was then deployed extending from the proximal P1 segment all the way to the proximal basilar artery. A control arteriogram performed through the [facility] guide  catheter in the right vertebral artery demonstrated revascularization of the occluded basilar artery with now patency noted of the basilar artery, the posterior cerebral arteries bilaterally, and the superior cerebellar arteries. Also demonstrated is patency of the proximal basilar artery and of the perforators and the left anterior-inferior cerebellar artery and the hypoplastic right anterior inferior cerebellar artery.  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  Mild-to-moderate disease of the distal basilar artery  angiographically was also evident.  The retrieval device was then recaptured with the Trevo ProVue microcatheter and the microcatheter again advanced into the P1 right middle posterior cerebral artery. The guidewire was removed. Good aspiration obtained from the hub of the microcatheter which was then connected to continuous heparinized saline infusion.  It was elected to revascularize the occluded basilar artery by placement of a stent reconstructed device. A 4 mm x 30 mm Enterprise vascular device was advanced to the distal end of the microcatheter.  The O ring on the delivery microcatheter was loosened. With slight forward gentle traction with the right hand on the delivery micro guidewire, with the left hand the delivery microcatheter was retrieved such that the distal landing zone was at the junction of the distal and the middle 1/3 of the basilar artery and the proximal being in the distal dominant right vertebrobasilar junction.  Following deployment of the stent, the delivery micro guidewire and the microcatheter were retrieved proximally. A control arteriogram performed through the Benchmark guide catheter in the vertebral artery now demonstrated complete revascularization of the basilar artery, the posterior cerebral arteries, the superior cerebellar arteries and the anterior-inferior cerebellar arteries.  There continued to be reasonable patency of the basilar artery in the stented and distal segments. Again evident was the extensive arteriosclerotic disease. At this time the patient was given 81 mg of aspirin, and 180 mg of Brilinta via an orogastric tube. Also the patient was given 6 mg of intra-arterial Integrilin via the Benchmark guide catheter in order to inhibit platelet aggregation within the stent.  Control arteriograms were then performed at 15 and 40 minutes post deployment of the stent. These continued to demonstrate excellent flow through the stented segment of the basilar artery with  perfusion of the posterior cerebral arteries, the superior cerebellar arteries and the anterior-inferior cerebellar arteries.  There continued to be approximately 60% to 70% stenosis of the site of the previous occlusion.  Given the brisk antegrade flow over this period of time without evident platelet aggregation it was elected to defer further endovascular intervention at this time.  A final control arteriogram performed through the 6 [place] Benchmark guide catheter in the right vertebral artery continued to demonstrate wide patency of the right vertebral artery proximal and intracranially.  This was then retrieved and removed over a 0.035 Roadrunner guidewire.  The right radial puncture site was then sealed with a wrist band for hemostasis. The distal right radial pulse was then verified to be present.  The patient was left intubated on account of his neurological dysfunction and to protect his airway.  He was then transferred to the neuro ICU to continue with post revascularization management.  IMPRESSION: Status post endovascular revascularization of occluded basilar artery with 1 pass with a 3 mm x 33 mm Trevo ProVue retrieval device, and placement of a 4 mm x 30 mm Enterprise vascular stent reconstruct with achievement of a TICI 2C revascularization as mentioned above.  PLAN: Follow-up in the clinic  4 weeks post discharge.  Patient to remain on aspirin 81 mg a day, and Brilinta 90 mg b.i.d.   Electronically Signed   By: Luanne Bras M.D.   On: 10/15/2019 10:05    IR US Guide Vasc Access Right  Result Date: 10/14/2019 INDICATION: Gradual decline in mentation with fall and left-sided weakness and severe dysarthria. 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  EXAM: 1. EMERGENT LARGE VESSEL OCCLUSION THROMBOLYSIS (POSTERIOR CIRCULATION)  COMPARISON:  CT angiogram of the head and neck of October 14, 2019.  MEDICATIONS: Ancef 2 g IV antibiotic was administered within 1  hour of the procedure.  ANESTHESIA/SEDATION: General anesthesia.  CONTRAST:  Isovue 300 approximately 150 mL.  FLUOROSCOPY TIME:  Fluoroscopy Time: 117 minutes 12 seconds (3624 mGy).  COMPLICATIONS: None immediate.  TECHNIQUE: Following a full explanation of the procedure along with the potential associated complications, an informed witnessed consent was obtained from the patient's spouse. The risks of intracranial hemorrhage of 10%, worsening neurological deficit, ventilator dependency, death and inability to revascularize were all reviewed in detail with the patient's spouse.  The patient was then put under general anesthesia by the Department of Anesthesiology at [facility] Group.  The right groin was prepped and draped in the usual sterile fashion. Thereafter using modified Seldinger technique, transfemoral access into the right common femoral artery was obtained without difficulty. Over a 0.035 inch guidewire a 5 French Pinnacle sheath was inserted. Through this, and also over a 0.035 inch guidewire a 5 [place] JB 1 catheter was advanced to the aortic arch region and selectively positioned in the innominate artery, the right vertebral artery, the right common carotid artery and the left vertebral artery.  The right radial artery was identified and evaluated with ultrasound and documented. Using ultrasound guidance, right radial access was then obtained using a micropuncture set. Over a 0.018 inch micro guidewire, a 6/7 radial sheath was then inserted without difficulty. The obturator, and the micro guidewire were removed. Good aspiration was obtained from the side port of the sheath. A cocktail of 2000 units of heparin, 2.5 mg of verapamil, and 200 mcg of nitroglycerin was then infused in diluted form without event.  Over a 0.035 Roadrunner guidewire, a Benchmark 95 cm 6 [place] guide catheter was then advanced and a 5 [place] catheter, and advanced into the proximal right vertebral artery.  The 5  [place] diagnostic catheter were retrieved and removed. The Benchmark 6 [place] guide catheter was then advanced into the proximal 1/3 of the right vertebral artery.  The guidewire was removed. Good aspiration obtained from the hub of the Benchmark guide catheter. A control arteriogram performed through Benchmark micro guide catheter demonstrated antegrade flow to the distal right vertebral artery.  FINDINGS: The innominate arteriogram demonstrates the origins of the right subclavian artery and the right common carotid artery to be widely patent.  The right vertebral artery origin demonstrates mild stenosis.  The vessel is seen to opacify to the cranial skull base.  Wide patency is seen of the right vertebrobasilar junction proximal to the right posterior-inferior cerebellar artery.  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  A string sign is demonstrated in the occluded basilar artery proximally. The right common carotid arteriogram demonstrates the right external carotid artery and its major branches to be widely patent. The right  internal carotid artery at the bulb to the cranial skull base demonstrates wide patency.  Wide patency is seen of the petrous, the cavernous and the supraclinoid segments.  Atherosclerotic narrowing is seen of the cavernous segment of the right internal carotid artery and the proximal carpal cavernous segment and the distal cavernous segment. The supraclinoid right ICA is widely patent.  The right middle and the right anterior cerebral arteries opacify into the capillary and venous phases. The left vertebral artery origin is from the aortic arch between the origin of the left subclavian artery and the left common carotid artery.  The vessel is seen to opacify to the cranial skull base  where wide patency is seen of the left posterior-inferior cerebellar artery. 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  PROCEDURE: The diagnostic JB 1 catheter initially in the distal right subclavian artery was then exchanged over a 0.035 inch 300 cm Rosen exchange guidewire for a 5 [place] Pinnacle sheath in the right groin which was connected to continuous heparinized saline infusion.  An 8 French 80 cm Neuron Max sheath was then positioned just proximal to the origin of the right vertebral artery followed by the removal of the exchange guidewire.  Over a 0.035 inch Roadrunner guidewire, a 6 French 132 cm Catalyst guide catheter was then advanced to the distal end of the Neuron Max sheath. The guidewire was then advanced into the proximal 1/3 of the right vertebral artery followed by the Catalyst guide catheter in the proximal 1/3.  However, more distal advancement of the system was met with significant resistance on account of the tortuosity of the aortic arch with herniation into the aortic arch. Further attempts were then made using a Benchmark 6 [place] guide catheter positioned in the proximal right vertebral artery.  An angiogram obtained through this demonstrated multiple focal areas of irregularity with mild-to-moderate narrowing persisting into the distal right vertebral artery at the cranial skull base.  A [place] 027 microcatheter was then advanced through the [facility] guide catheter over a 0.014 inch standard Synchro micro guidewire and then over a 0.016 inch Fathom micro guidewire. The microcatheter could be advanced to the level of the cranial skull base.  However, further advancements again were met with significant proximal herniation of the platform into the aortic arch region. After multiple attempts, the femoral approach was then abandoned.  The right radial artery was then accessed  using the 6 French 95 cm Benchmark guide catheter which was positioned in the proximal 1/3 of the right vertebral artery as described above.  Over a 0.014 inch standard Synchro micro guidewire, an 021 Trevo ProVue microcatheter was then advanced using biplane roadmap technique and constant fluoroscopic guidance to the right vertebrobasilar junction.  Access through the occluded basilar artery was then obtained gradually, slowly and deliberately until the micro guidewire was advanced without difficulty into the right posterior cerebral artery P1 P2 junction followed by the microcatheter.  The guidewire was removed. Good aspiration was obtained from the hub of the microcatheter. A gentle control arteriogram performed through the microcatheter demonstrated safe position of tip of the microcatheter.  A 3 mm x 33 mm Trevo ProVue retrieval device was then advanced to the distal end of the microcatheter.  The O ring on the delivery microcatheter was loosened. With slight forward gentle traction with the right hand on the delivery micro guidewire the retrieval device was then deployed extending from the proximal  P1 segment all the way to the proximal basilar artery. A control arteriogram performed through the Benchmark guide catheter in the right vertebral artery demonstrated revascularization of the occluded basilar artery with now patency noted of the basilar artery, the posterior cerebral arteries bilaterally, and the superior cerebellar arteries. Also demonstrated is patency of the proximal basilar artery and of the perforators and the left anterior-inferior cerebellar artery and the hypoplastic right anterior inferior cerebellar artery.  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  Mild-to-moderate disease of the distal basilar artery angiographically was also evident.  The retrieval device was then recaptured with  the Trevo ProVue microcatheter and the microcatheter again advanced into the P1 right middle posterior cerebral artery. The guidewire was removed. Good aspiration obtained from the hub of the microcatheter which was then connected to continuous heparinized saline infusion.  It was elected to revascularize the occluded basilar artery by placement of a stent reconstructed device. A 4 mm x 30 mm Enterprise vascular device was advanced to the distal end of the microcatheter.  The O ring on the delivery microcatheter was loosened. With slight forward gentle traction with the right hand on the delivery micro guidewire, with the left hand the delivery microcatheter was retrieved such that the distal landing zone was at the junction of the distal and the middle 1/3 of the basilar artery and the proximal being in the distal dominant right vertebrobasilar junction.  Following deployment of the stent, the delivery micro guidewire and the microcatheter were retrieved proximally. A control arteriogram performed through the Benchmark guide catheter in the vertebral artery now demonstrated complete revascularization of the basilar artery, the posterior cerebral arteries, the superior cerebellar arteries and the anterior-inferior cerebellar arteries.  There continued to be reasonable patency of the basilar artery in the stented and distal segments. Again evident was the extensive arteriosclerotic disease. At this time the patient was given 81 mg of aspirin, and 180 mg of Brilinta via an orogastric tube. Also the patient was given 6 mg of intra-arterial Integrilin via the Benchmark guide catheter in order to inhibit platelet aggregation within the stent.  Control arteriograms were then performed at 15 and 40 minutes post deployment of the stent. These continued to demonstrate excellent flow through the stented segment of the basilar artery with perfusion of the posterior cerebral arteries, the superior cerebellar arteries and  the anterior-inferior cerebellar arteries.  There continued to be approximately 60% to 70% stenosis of the site of the previous occlusion.  Given the brisk antegrade flow over this period of time without evident platelet aggregation it was elected to defer further endovascular intervention at this time.  A final control arteriogram performed through the 6 [place] Benchmark guide catheter in the right vertebral artery continued to demonstrate wide patency of the right vertebral artery proximal and intracranially.  This was then retrieved and removed over a 0.035 Roadrunner guidewire.  The right radial puncture site was then sealed with a wrist band for hemostasis. The distal right radial pulse was then verified to be present.  The patient was left intubated on account of his neurological dysfunction and to protect his airway.  He was then transferred to the neuro ICU to continue with post revascularization management.  IMPRESSION: Status post endovascular revascularization of occluded basilar artery with 1 pass with a 3 mm x 33 mm Trevo ProVue retrieval device, and placement of a 4 mm x 30 mm Enterprise vascular  stent reconstruct with achievement of a TICI 2C revascularization as mentioned above.  PLAN: Follow-up in the clinic 4 weeks post discharge.  Patient to remain on aspirin 81 mg a day, and Brilinta 90 mg b.i.d.   Electronically Signed   By: Luanne Bras M.D.   On: 10/15/2019 10:05    CT C-SPINE NO CHARGE  Result Date: 10/14/2019 CLINICAL DATA:  Neurologic deficits, fall EXAM: CT CERVICAL SPINE WITHOUT CONTRAST TECHNIQUE: Multidetector CT imaging of the cervical spine was performed without intravenous contrast. Multiplanar CT image reconstructions were also generated. COMPARISON:  Same day CT angiography of the head neck FINDINGS: Alignment: Slight exaggeration of the normal cervical lordosis. Mild anterolisthesis of C4 on C5 likely on a facet degenerative basis. No abnormally perched, jump  to widened facets. Craniocervical into axial alignment is maintained. Skull base and vertebrae: Degenerative appearing fusion of the left articular facet at C2-3. Likely degenerative fusion of the right articular facets at C5-6 as well. No acute fracture. No primary bone lesion or focal pathologic process. Soft tissues and spinal canal: No pre or paravertebral fluid or swelling. No visible canal hematoma. Disc levels: Multilevel moderate to severe intervertebral disc height loss with vacuum phenomenon at C3-4 and C6-7 as well as multilevel cervical spondylitic endplate changes with advanced uncinate spurring and facet hypertrophic changes well. Findings result in at least moderate canal stenosis at C5-6 and C6-7 as well as multilevel mild-to-moderate neural foraminal narrowing. Degenerative changes are milder in the upper thoracic spine. Upper chest: Secretions noted in the posterior oropharynx. Debris noted in the upper thoracic esophagus. Biapical pleuroparenchymal scarring is present. Other: Thyroid gland is unremarkable. Cervical carotid atherosclerosis better assessed on CT angiography. IMPRESSION: 1. No acute osseous abnormality or traumatic listhesis. 2. Slight exaggeration of the normal cervical lordosis with mild anterolisthesis of C4 on C5 likely on a facet degenerative basis. Degenerative fusion of the left articular facet at C2-3 and right articular facet at C5-6. 3. Multilevel moderate to severe cervical spondylosis with advanced uncinate spurring and facet hypertrophic changes resulting in at least moderate canal stenosis at C5-6 and C6-7 as well as multilevel mild-to-moderate neural foraminal narrowing. 4. Secretions noted in the upper thoracic esophagus. Correlate for reflux. 5. Cervical carotid atherosclerosis better assessed on CT angiography. Electronically Signed   By: Lovena Le M.D.   On: 10/14/2019 05:57   CT Code Stroke Cerebral Perfusion with contrast  Result Date: 10/14/2019 CLINICAL  DATA:  Slurred speech and left-sided weakness EXAM: CT ANGIOGRAPHY HEAD AND NECK CT PERFUSION BRAIN TECHNIQUE: Multidetector CT imaging of the head and neck was performed using the standard protocol during bolus administration of intravenous contrast. Multiplanar CT image reconstructions and MIPs were obtained to evaluate the vascular anatomy. Carotid stenosis measurements (when applicable) are obtained utilizing NASCET criteria, using the distal internal carotid diameter as the denominator. Multiphase CT imaging of the brain was performed following IV bolus contrast injection. Subsequent parametric perfusion maps were calculated using RAPID software. CONTRAST:  Dose is currently not available COMPARISON:  Noncontrast head CT earlier today FINDINGS: CTA NECK FINDINGS Aortic arch: Atherosclerotic plaque that is extensive. Four vessel branching. Right carotid system: Relatively mild atherosclerotic plaque at the bifurcation without flow limiting stenosis. No ulceration. Left carotid system: Partial retropharyngeal course. Relatively mild atherosclerotic calcification mainly at the bifurcation without flow limiting stenosis or ulceration. Vertebral arteries: Left vertebral artery arises from the arch. Moderate atheromatous type narrowing at the right V1 segment. Both vertebral arteries are patent at the level of  the dural penetration. Skeleton: Spinal degeneration and spondylosis. No acute or aggressive finding Other neck: Port in place. Upper chest: Distended esophagus containing semi solid appearing material were covered. Review of the MIP images confirms the above findings CTA HEAD FINDINGS Anterior circulation: Atherosclerotic plaque along the carotid siphons. No branch occlusion, beading, or aneurysm. Posterior circulation: Dominant right vertebral artery. There is high-grade narrowing of the left V4 segment. Although the basilar is likely hypoplastic in the setting of fetal type left PCA, there is superimposed  thready flow followed by occlusion with no flow seen at the basilar tip or throughout the large majority of the right PCA. High-grade left P2 segment stenosis. Venous sinuses: Patent Anatomic variants: As above Review of the MIP images confirms the above findings CT Brain Perfusion Findings: ASPECTS: 10 CBF (<30%) Volume: 48m Perfusion (Tmax>6.0s) volume: 447mat the level of the pons Critical Value/emergent results were called by telephone at the time of interpretation on 10/14/2019 at 5:28 am to prKenmar who verbally acknowledged these results. IMPRESSION: 1. Occluded mid to distal basilar and right PCA. There is correlative abnormal perfusion at the level of the upper pons. 2. High-grade left P2 and left V4 segment stenoses. 3. No flow limiting stenosis or large vessel occlusion in the anterior circulation. 4. Distended upper thoracic esophagus likely containing debris. Electronically Signed   By: JoMonte Fantasia.D.   On: 10/14/2019 05:32   DG CHEST PORT 1 VIEW  Result Date: 10/17/2019 CLINICAL DATA:  Fever.  Hypoxia. EXAM: PORTABLE CHEST 1 VIEW COMPARISON:  October 14, 2019 FINDINGS: Endotracheal tube tip is 5.5 cm above the carina. Port-A-Cath tip is in the superior vena cava. Nasogastric tube tip and side port are in stomach. No pneumothorax. There is patchy airspace opacity in the right lower lung region. There is mild left base atelectasis. Lungs elsewhere clear. Heart size and pulmonary vascular normal. There is aortic atherosclerosis. No adenopathy. There old healed rib fractures on the left. Bones are osteoporotic. IMPRESSION: Tube and catheter positions as described without pneumothorax. Apparent pneumonia right base. Mild left base atelectasis. Heart size normal. Aortic Atherosclerosis (ICD10-I70.0). Electronically Signed   By: WiLowella GripII M.D.   On: 10/17/2019 09:55   DG CHEST PORT 1 VIEW  Result Date: 10/14/2019 CLINICAL DATA:  Post op/ ETT adjusted. EXAM: PORTABLE  CHEST 1 VIEW COMPARISON:  Radiograph 10/14/2019 FINDINGS: Endotracheal to 5 cm from carina. Port in the anterior chest wall with tip in distal SVC. NG tube in stomach. Stable cardiac silhouette. Small LEFT effusion. Hyperinflated lungs. No pneumonia. IMPRESSION: 1. No tracheal tube in good position. 2. Small LEFT effusion. 3. Hyperinflated lungs mild basilar atelectasis. Electronically Signed   By: StSuzy Bouchard.D.   On: 10/14/2019 13:23   DG CHEST PORT 1 VIEW  Result Date: 10/14/2019 CLINICAL DATA:  ETT placement EXAM: PORTABLE CHEST 1 VIEW COMPARISON:  None. FINDINGS: Endotracheal tube with the tip 6 cm above carina. NG tube coursing below the diaphragm. Port a cath with the tip projecting over the SVC. Bilateral mild interstitial thickening. Hazy right lower lobe airspace disease. Trace left pleural effusion. Normal heart size. Opacity in the upper mediastinum and partially projecting over the right lung apex. Thoracic aortic atherosclerosis. No aggressive osseous lesion. IMPRESSION: 1. Support lines and tubing as described above. 2. Opacity in the upper mediastinum and partially projecting over the right lung apex likely artifactual. 3. Trace left pleural effusion. Hazy right lower lobe airspace disease which may reflect atelectasis versus pneumonia.  4. Electronically Signed   By: Kathreen Devoid   On: 10/14/2019 12:19   ECHOCARDIOGRAM COMPLETE  Result Date: 10/14/2019   ECHOCARDIOGRAM REPORT   Patient Name:   MCKINNON GLICK Date of Exam: 10/14/2019 Medical Rec #:  [id number]    Height:       66.0 in Accession #:    [id number]   Weight:       136.7 lb Date of Birth:  08/06/1932     BSA:          1.70 m Patient Age:    87 years     BP:           131/63 mmHg Patient Gender: M            HR:           91 bpm. Exam Location:  Inpatient Procedure: 2D Echo, Cardiac Doppler and Color Doppler Indications:    CVA  History:        Patient has no prior history of Echocardiogram examinations.                 Risk  Factors:Hypertension. Esophageal cancer, chemo, resp.                 failure.  Sonographer:    Dustin Flock Referring Phys: [place]  1. Left ventricular ejection fraction, by visual estimation, is 65 to 70%. The left ventricle has hyperdynamic function. There is no left ventricular hypertrophy.  2. Left ventricular diastolic parameters are consistent with Grade II diastolic dysfunction (pseudonormalization).  3. The left ventricle has no regional wall motion abnormalities.  4. Global right ventricle has normal systolic function.The right ventricular size is normal. No increase in right ventricular wall thickness.  5. Left atrial size was normal.  6. Right atrial size was normal.  7. The mitral valve is normal in structure. Trivial mitral valve regurgitation.  8. The tricuspid valve is normal in structure.  9. The aortic valve is tricuspid. Aortic valve regurgitation is not visualized. Mild aortic valve sclerosis without stenosis. 10. The pulmonic valve was not well visualized. Pulmonic valve regurgitation is not visualized. 11. Moderately elevated pulmonary artery systolic pressure. 12. The tricuspid regurgitant velocity is 3.50 m/s, and with an assumed right atrial pressure of 8 mmHg, the estimated right ventricular systolic pressure is moderately elevated at 57.0 mmHg. 13. The inferior vena cava is normal in size with <50% respiratory variability, suggesting right atrial pressure of 8 mmHg. 14. The atrial septum is grossly normal. 15. Cannot exclude ASD/PFO. Consider transesophageal echocardiogram, if clinically indicated. FINDINGS  Left Ventricle: Left ventricular ejection fraction, by visual estimation, is 65 to 70%. The left ventricle has hyperdynamic function. The left ventricle has no regional wall motion abnormalities. There is no left ventricular hypertrophy. Left ventricular diastolic parameters are consistent with Grade II diastolic dysfunction (pseudonormalization).  Right Ventricle: The right ventricular size is normal. No increase in right ventricular wall thickness. Global RV systolic function is has normal systolic function. The tricuspid regurgitant velocity is 3.50 m/s, and with an assumed right atrial pressure  of 8 mmHg, the estimated right ventricular systolic pressure is moderately elevated at 57.0 mmHg. Left Atrium: Left atrial size was normal in size. Right Atrium: Right atrial size was normal in size Pericardium: There is no evidence of pericardial effusion. Mitral Valve: The mitral valve is normal in structure. There is mild thickening of the mitral valve leaflet(s). Trivial mitral valve regurgitation. Tricuspid Valve: The  tricuspid valve is normal in structure. Tricuspid valve regurgitation mild-moderate. Aortic Valve: The aortic valve is tricuspid. . There is mild thickening and mild calcification of the aortic valve. Aortic valve regurgitation is not visualized. Mild aortic valve sclerosis is present, with no evidence of aortic valve stenosis. There is mild thickening of the aortic valve. There is mild calcification of the aortic valve. Pulmonic Valve: The pulmonic valve was not well visualized. Pulmonic valve regurgitation is not visualized. Pulmonic regurgitation is not visualized. Aorta: The aortic root, ascending aorta and aortic arch are all structurally normal, with no evidence of dilitation or obstruction. Pulmonary Artery: The pulmonary artery is not well seen. Venous: The inferior vena cava is normal in size with less than 50% respiratory variability, suggesting right atrial pressure of 8 mmHg. IAS/Shunts: The atrial septum is grossly normal. Additional Comments: Flow jet at the atrial septum was seen, possible venous return; however, could not exclude ASD/PFO. If clinically indicated consider transesophageal echocardiogram.  LEFT VENTRICLE PLAX 2D LVIDd:         3.90 cm  Diastology LVIDs:         1.70 cm  LV e' lateral:   6.74 cm/s LV PW:         1.00  cm  LV E/e' lateral: 16.3 LV IVS:        1.00 cm  LV e' medial:    5.98 cm/s LVOT diam:     2.00 cm  LV E/e' medial:  18.4 LV SV:         58 ml LV SV Index:   33.82 LVOT Area:     3.14 cm  RIGHT VENTRICLE RV Basal diam:  3.20 cm RV S prime:     16.00 cm/s TAPSE (M-mode): 2.6 cm LEFT ATRIUM           Index       RIGHT ATRIUM           Index LA diam:      2.90 cm 1.70 cm/m  RA Area:     13.50 cm LA Vol (A4C): 27.1 ml 15.93 ml/m RA Volume:   33.30 ml  19.58 ml/m  AORTIC VALVE LVOT Vmax:   114.00 cm/s LVOT Vmean:  67.900 cm/s LVOT VTI:    0.215 m  AORTA Ao Root diam: 3.20 cm MITRAL VALVE                         TRICUSPID VALVE MV Area (PHT): 7.37 cm              TR Peak grad:   49.0 mmHg MV PHT:        29.87 msec            TR Vmax:        354.00 cm/s MV Decel Time: 103 msec MV E velocity: 110.00 cm/s 103 cm/s  SHUNTS MV A velocity: 158.00 cm/s 70.3 cm/s Systemic VTI:  0.22 m MV E/A ratio:  0.70        1.5       Systemic Diam: 2.00 cm  Buford Dresser MD Electronically signed by Buford Dresser MD Signature Date/Time: 10/14/2019/5:46:44 PM    Final    IR PERCUTANEOUS ART THROMBECTOMY/INFUSION INTRACRANIAL INC DIAG ANGIO  Result Date: 10/18/2019 INDICATION: Gradual decline in mentation with fall and left-sided weakness and severe dysarthria. 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 EXAM: 1. EMERGENT LARGE VESSEL OCCLUSION THROMBOLYSIS (POSTERIOR CIRCULATION)  COMPARISON:  CT angiogram of the head and neck of October 14, 2019. MEDICATIONS: Ancef 2 g IV antibiotic was administered within 1 hour of the procedure. ANESTHESIA/SEDATION: General anesthesia. CONTRAST:  Isovue 300 approximately 150 mL. FLUOROSCOPY TIME:  Fluoroscopy Time: 117 minutes 12 seconds (3624 mGy). COMPLICATIONS: None immediate. TECHNIQUE: Following a full explanation of the procedure along with the potential associated complications, an informed witnessed consent was obtained from the patient's spouse. The risks of  intracranial hemorrhage of 10%, worsening neurological deficit, ventilator dependency, death and inability to revascularize were all reviewed in detail with the patient's spouse. The patient was then put under general anesthesia by the Department of Anesthesiology at [facility]. The right groin was prepped and draped in the usual sterile fashion. Thereafter using modified Seldinger technique, transfemoral access into the right common femoral artery was obtained without difficulty. Over a 0.035 inch guidewire a 5 French Pinnacle sheath was inserted. Through this, and also over a 0.035 inch guidewire a 5 [place] JB 1 catheter was advanced to the aortic arch region and selectively positioned in the innominate artery, the right vertebral artery, the right common carotid artery and the left vertebral artery. The right radial artery was identified and evaluated with ultrasound and documented. Using ultrasound guidance, right radial access was then obtained using a micropuncture set. Over a 0.018 inch micro guidewire, a 6/7 radial sheath was then inserted without difficulty. The obturator, and the micro guidewire were removed. Good aspiration was obtained from the side port of the sheath. A cocktail of 2000 units of heparin, 2.5 mg of verapamil, and 200 mcg of nitroglycerin was then infused in diluted form without event. Over a 0.035 Roadrunner guidewire, a Benchmark 95 cm 6 [place] guide catheter was then advanced and a 5 [place] catheter, and advanced into the proximal right vertebral artery. The 5 [place] diagnostic catheter were retrieved and removed. The Benchmark 6 [place] guide catheter was then advanced into the proximal 1/3 of the right vertebral artery. The guidewire was removed. Good aspiration obtained from the hub of the Benchmark guide catheter. A control arteriogram performed through Benchmark micro guide catheter demonstrated antegrade flow to the distal right vertebral artery. FINDINGS: The innominate  arteriogram demonstrates the origins of the right subclavian artery and the right common carotid artery to be widely patent. The right vertebral artery origin demonstrates mild stenosis. The vessel is seen to opacify to the cranial skull base. Wide patency is seen of the right vertebrobasilar junction proximal to the right posterior-inferior cerebellar artery. 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 A string sign is demonstrated in the occluded basilar artery proximally. The right common carotid arteriogram demonstrates the right external carotid artery and its major branches to be widely patent. The right internal carotid artery at the bulb to the cranial skull base demonstrates wide patency. Wide patency is seen of the petrous, the cavernous and the supraclinoid segments. Atherosclerotic narrowing is seen of the cavernous segment of the right internal carotid artery and the proximal carpal cavernous segment and the distal cavernous segment. The supraclinoid right ICA is widely patent. The right middle and the right anterior cerebral arteries opacify into the capillary and venous phases. The left vertebral artery origin is from the aortic arch between the origin of the left subclavian artery and the left common carotid artery. The vessel  is seen to opacify to the cranial skull base where wide patency is seen of the left posterior-inferior cerebellar artery. 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 PROCEDURE: The diagnostic JB 1 catheter initially in the distal right subclavian artery was then exchanged over a 0.035 inch 300 cm Rosen exchange guidewire for a 5 [place] Pinnacle sheath in the right groin which was  connected to continuous heparinized saline infusion. An 8 French 80 cm Neuron Max sheath was then positioned just proximal to the origin of the right vertebral artery followed by the removal of the exchange guidewire. Over a 0.035 inch Roadrunner guidewire, a 6 French 132 cm Catalyst guide catheter was then advanced to the distal end of the Neuron Max sheath. The guidewire was then advanced into the proximal 1/3 of the right vertebral artery followed by the Catalyst guide catheter in the proximal 1/3. However, more distal advancement of the system was met with significant resistance on account of the tortuosity of the aortic arch with herniation into the aortic arch. Further attempts were then made using a Benchmark 6 [place] guide catheter positioned in the proximal right vertebral artery. An angiogram obtained through this demonstrated multiple focal areas of irregularity with mild-to-moderate narrowing persisting into the distal right vertebral artery at the cranial skull base. A Marathon 027 microcatheter was then advanced through the [facility] guide catheter over a 0.014 inch standard Synchro micro guidewire and then over a 0.016 inch Fathom micro guidewire. The microcatheter could be advanced to the level of the cranial skull base. However, further advancements again were met with significant proximal herniation of the platform into the aortic arch region. After multiple attempts, the femoral approach was then abandoned. The right radial artery was then accessed using the 6 French 95 cm Benchmark guide catheter which was positioned in the proximal 1/3 of the right vertebral artery as described above. Over a 0.014 inch standard Synchro micro guidewire, an 021 Trevo ProVue microcatheter was then advanced using biplane roadmap technique and constant fluoroscopic guidance to the right vertebrobasilar junction. Access through the occluded basilar artery was then obtained gradually, slowly and deliberately until the  micro guidewire was advanced without difficulty into the right posterior cerebral artery P1 P2 junction followed by the microcatheter. The guidewire was removed. Good aspiration was obtained from the hub of the microcatheter. A gentle control arteriogram performed through the microcatheter demonstrated safe position of tip of the microcatheter. A 3 mm x 33 mm Trevo ProVue retrieval device was then advanced to the distal end of the microcatheter. The O ring on the delivery microcatheter was loosened. With slight forward gentle traction with the right hand on the delivery micro guidewire the retrieval device was then deployed extending from the proximal P1 segment all the way to the proximal basilar artery. A control arteriogram performed through the Benchmark guide catheter in the right vertebral artery demonstrated revascularization of the occluded basilar artery with now patency noted of the basilar artery, the posterior cerebral arteries bilaterally, and the superior cerebellar arteries. Also demonstrated is patency of the proximal basilar artery and of the perforators and the left anterior-inferior cerebellar artery and the hypoplastic right anterior inferior cerebellar artery. 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 Mild-to-moderate disease of the distal basilar artery angiographically was also evident. The retrieval device was then recaptured with the Trevo ProVue microcatheter and  the microcatheter again advanced into the P1 right middle posterior cerebral artery. The guidewire was removed. Good aspiration obtained from the hub of the microcatheter which was then connected to continuous heparinized saline infusion. It was elected to revascularize the occluded basilar artery by placement of a stent reconstructed device. A 4 mm x 30 mm Enterprise vascular device was advanced to the distal end of the microcatheter.  The O ring on the delivery microcatheter was loosened. With slight forward gentle traction with the right hand on the delivery micro guidewire, with the left hand the delivery microcatheter was retrieved such that the distal landing zone was at the junction of the distal and the middle 1/3 of the basilar artery and the proximal being in the distal dominant right vertebrobasilar junction. Following deployment of the stent, the delivery micro guidewire and the microcatheter were retrieved proximally. A control arteriogram performed through the Benchmark guide catheter in the vertebral artery now demonstrated complete revascularization of the basilar artery, the posterior cerebral arteries, the superior cerebellar arteries and the anterior-inferior cerebellar arteries. There continued to be reasonable patency of the basilar artery in the stented and distal segments. Again evident was the extensive arteriosclerotic disease. At this time the patient was given 81 mg of aspirin, and 180 mg of Brilinta via an orogastric tube. Also the patient was given 6 mg of intra-arterial Integrilin via the Benchmark guide catheter in order to inhibit platelet aggregation within the stent. Control arteriograms were then performed at 15 and 40 minutes post deployment of the stent. These continued to demonstrate excellent flow through the stented segment of the basilar artery with perfusion of the posterior cerebral arteries, the superior cerebellar arteries and the anterior-inferior cerebellar arteries. There continued to be approximately 60% to 70% stenosis of the site of the previous occlusion. Given the brisk antegrade flow over this period of time without evident platelet aggregation it was elected to defer further endovascular intervention at this time. A final control arteriogram performed through the 6 [place] Benchmark guide catheter in the right vertebral artery continued to demonstrate wide patency of the right vertebral artery  proximal and intracranially. This was then retrieved and removed over a 0.035 Roadrunner guidewire. The right radial puncture site was then sealed with a wrist band for hemostasis. The distal right radial pulse was then verified to be present. The patient was left intubated on account of his neurological dysfunction and to protect his airway. He was then transferred to the neuro ICU to continue with post revascularization management. IMPRESSION: Status post endovascular revascularization of occluded basilar artery with 1 pass with a 3 mm x 33 mm Trevo ProVue retrieval device, and placement of a 4 mm x 30 mm Enterprise vascular stent reconstruct with achievement of a TICI 2C revascularization as mentioned above. PLAN: Follow-up in the clinic 4 weeks post discharge. Patient to remain on aspirin 81 mg a day, and Brilinta 90 mg b.i.d. Electronically Signed   By: Luanne Bras M.D.   On: 10/15/2019 10:05   CT HEAD CODE STROKE WO CONTRAST  Result Date: 10/14/2019 CLINICAL DATA:  Code stroke.  Slurred speech and left-sided weakness EXAM: CT HEAD WITHOUT CONTRAST TECHNIQUE: Contiguous axial images were obtained from the base of the skull through the vertex without intravenous contrast. COMPARISON:  None. FINDINGS: Brain: No evidence of acute infarction, hemorrhage, hydrocephalus, extra-axial collection or mass lesion/mass effect. Mild chronic small vessel ischemia and volume loss for age. Vascular: No hyperdense vessel or unexpected calcification. Skull: Normal. Negative for  fracture or focal lesion. Sinuses/Orbits: Negative Other: These results were communicated to Dr. Leonel Ramsay at 5:12 amon 1/7/2021by text page via the [facility] messaging system. ASPECTS [facility] Stroke Program Early CT Score) - Ganglionic level infarction (caudate, lentiform nuclei, internal capsule, insula, M1-M3 cortex): 7 - Supraganglionic infarction (M4-M6 cortex): 3 Total score (0-10 with 10 being normal): 10 IMPRESSION: No acute finding.   ASPECTS is 10. Electronically Signed   By: Monte Fantasia M.D.   On: 10/14/2019 05:13   IR ANGIO INTRA EXTRACRAN SEL COM CAROTID INNOMINATE UNI R MOD SED  Result Date: 10/14/2019 INDICATION: Gradual decline in mentation with fall and left-sided weakness and severe dysarthria. 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  EXAM: 1. EMERGENT LARGE VESSEL OCCLUSION THROMBOLYSIS (POSTERIOR CIRCULATION)  COMPARISON:  CT angiogram of the head and neck of October 14, 2019.  MEDICATIONS: Ancef 2 g IV antibiotic was administered within 1 hour of the procedure.  ANESTHESIA/SEDATION: General anesthesia.  CONTRAST:  Isovue 300 approximately 150 mL.  FLUOROSCOPY TIME:  Fluoroscopy Time: 117 minutes 12 seconds (3624 mGy).  COMPLICATIONS: None immediate.  TECHNIQUE: Following a full explanation of the procedure along with the potential associated complications, an informed witnessed consent was obtained from the patient's spouse. The risks of intracranial hemorrhage of 10%, worsening neurological deficit, ventilator dependency, death and inability to revascularize were all reviewed in detail with the patient's spouse.  The patient was then put under general anesthesia by the Department of Anesthesiology at [facility].  The right groin was prepped and draped in the usual sterile fashion. Thereafter using modified Seldinger technique, transfemoral access into the right common femoral artery was obtained without difficulty. Over a 0.035 inch guidewire a 5 French Pinnacle sheath was inserted. Through this, and also over a 0.035 inch guidewire a 5 [place] JB 1 catheter was advanced to the aortic arch region and selectively positioned in the innominate artery, the right vertebral artery, the right common carotid artery and the left vertebral artery.  The right radial artery was identified and evaluated with ultrasound and documented. Using ultrasound guidance, right radial access was then obtained  using a micropuncture set. Over a 0.018 inch micro guidewire, a 6/7 radial sheath was then inserted without difficulty. The obturator, and the micro guidewire were removed. Good aspiration was obtained from the side port of the sheath. A cocktail of 2000 units of heparin, 2.5 mg of verapamil, and 200 mcg of nitroglycerin was then infused in diluted form without event.  Over a 0.035 Roadrunner guidewire, a Benchmark 95 cm 6 [place] guide catheter was then advanced and a 5 [place] catheter, and advanced into the proximal right vertebral artery.  The 5 [place] diagnostic catheter were retrieved and removed. The Benchmark 6 [place] guide catheter was then advanced into the proximal 1/3 of the right vertebral artery.  The guidewire was removed. Good aspiration obtained from the hub of the Benchmark guide catheter. A control arteriogram performed through Benchmark micro guide catheter demonstrated antegrade flow to the distal right vertebral artery.  FINDINGS: The innominate arteriogram demonstrates the origins of the right subclavian artery and the right common carotid artery to be widely patent.  The right vertebral artery origin demonstrates mild stenosis.  The vessel is seen to opacify to the cranial skull base.  Wide patency is seen of the right vertebrobasilar junction proximal to the right posterior-inferior cerebellar artery.  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  A string sign is demonstrated in the occluded basilar artery proximally. The right common carotid arteriogram demonstrates the right external carotid artery and its major branches to be widely patent. The right internal carotid artery at the bulb to the cranial skull base demonstrates wide patency.  Wide patency is seen of the petrous, the cavernous  and the supraclinoid segments.  Atherosclerotic narrowing is seen of the cavernous segment of the right internal carotid artery and the proximal carpal cavernous segment and the distal cavernous segment. The supraclinoid right ICA is widely patent.  The right middle and the right anterior cerebral arteries opacify into the capillary and venous phases. The left vertebral artery origin is from the aortic arch between the origin of the left subclavian artery and the left common carotid artery.  The vessel is seen to opacify to the cranial skull base where wide patency is seen of the left posterior-inferior cerebellar artery. 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  PROCEDURE: The diagnostic JB 1 catheter initially in the distal right subclavian artery was then exchanged over a 0.035 inch 300 cm Rosen exchange guidewire for a 5 [place] Pinnacle sheath in the right groin which was connected to continuous heparinized saline infusion.  An 8 French 80 cm Neuron Max sheath was then positioned just proximal to the origin of the right vertebral artery followed by the removal of the exchange guidewire.  Over a 0.035 inch Roadrunner guidewire, a 6 French 132 cm Catalyst guide catheter was then advanced to the distal end of the Neuron Max sheath. The guidewire was then advanced into the proximal 1/3 of the right vertebral artery followed by the Catalyst guide catheter in the proximal 1/3.  However, more distal advancement of the system was met with significant resistance on account of the tortuosity of the aortic arch with herniation into the aortic arch. Further attempts were then made using a Benchmark 6 [place] guide catheter positioned in the proximal right vertebral artery.  An angiogram obtained through this demonstrated multiple focal areas of irregularity with mild-to-moderate narrowing persisting into the  distal right vertebral artery at the cranial skull base.  A Marathon 027 microcatheter was then advanced through the [facility] guide catheter over a 0.014 inch standard Synchro micro guidewire and then over a 0.016 inch Fathom micro guidewire. The microcatheter could be advanced to the level of the cranial skull base.  However, further advancements again were met with significant proximal herniation of the platform into the aortic arch region. After multiple attempts, the femoral approach was then abandoned.  The right radial artery was then accessed using the 6 French 95 cm Benchmark guide catheter which was positioned in the proximal 1/3 of the right vertebral artery as described above.  Over a 0.014 inch standard Synchro micro guidewire, an 021 Trevo ProVue microcatheter was then advanced using biplane roadmap technique and constant fluoroscopic guidance to the right vertebrobasilar junction.  Access through the occluded basilar artery was then obtained gradually, slowly and deliberately until the micro guidewire was advanced without difficulty into the right posterior cerebral artery P1 P2 junction followed by the microcatheter.  The guidewire was removed. Good aspiration was obtained from the hub of the microcatheter. A gentle control arteriogram performed through the microcatheter demonstrated safe position of tip of the microcatheter.  A 3 mm  x 33 mm Trevo ProVue retrieval device was then advanced to the distal end of the microcatheter.  The O ring on the delivery microcatheter was loosened. With slight forward gentle traction with the right hand on the delivery micro guidewire the retrieval device was then deployed extending from the proximal P1 segment all the way to the proximal basilar artery. A control arteriogram performed through the Benchmark guide catheter in the right vertebral artery demonstrated revascularization of the occluded basilar artery with now patency noted of the basilar artery,  the posterior cerebral arteries bilaterally, and the superior cerebellar arteries. Also demonstrated is patency of the proximal basilar artery and of the perforators and the left anterior-inferior cerebellar artery and the hypoplastic right anterior inferior cerebellar artery.  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  Mild-to-moderate disease of the distal basilar artery angiographically was also evident.  The retrieval device was then recaptured with the Trevo ProVue microcatheter and the microcatheter again advanced into the P1 right middle posterior cerebral artery. The guidewire was removed. Good aspiration obtained from the hub of the microcatheter which was then connected to continuous heparinized saline infusion.  It was elected to revascularize the occluded basilar artery by placement of a stent reconstructed device. A 4 mm x 30 mm Enterprise vascular device was advanced to the distal end of the microcatheter.  The O ring on the delivery microcatheter was loosened. With slight forward gentle traction with the right hand on the delivery micro guidewire, with the left hand the delivery microcatheter was retrieved such that the distal landing zone was at the junction of the distal and the middle 1/3 of the basilar artery and the proximal being in the distal dominant right vertebrobasilar junction.  Following deployment of the stent, the delivery micro guidewire and the microcatheter were retrieved proximally. A control arteriogram performed through the Benchmark guide catheter in the vertebral artery now demonstrated complete revascularization of the basilar artery, the posterior cerebral arteries, the superior cerebellar arteries and the anterior-inferior cerebellar arteries.  There continued to be reasonable patency of the basilar artery in the stented and distal segments. Again evident was the extensive  arteriosclerotic disease. At this time the patient was given 81 mg of aspirin, and 180 mg of Brilinta via an orogastric tube. Also the patient was given 6 mg of intra-arterial Integrilin via the Benchmark guide catheter in order to inhibit platelet aggregation within the stent.  Control arteriograms were then performed at 15 and 40 minutes post deployment of the stent. These continued to demonstrate excellent flow through the stented segment of the basilar artery with perfusion of the posterior cerebral arteries, the superior cerebellar arteries and the anterior-inferior cerebellar arteries.  There continued to be approximately 60% to 70% stenosis of the site of the previous occlusion.  Given the brisk antegrade flow over this period of time without evident platelet aggregation it was elected to defer further endovascular intervention at this time.  A final control arteriogram performed through the 6 [place] Benchmark guide catheter in the right vertebral artery continued to demonstrate wide patency of the right vertebral artery proximal and intracranially.  This was then retrieved and removed over a 0.035 Roadrunner guidewire.  The right radial puncture site was then sealed with a wrist band for hemostasis. The distal right radial pulse was then verified to be present.  The patient was left intubated on account of his neurological dysfunction and to protect his  airway.  He was then transferred to the neuro ICU to continue with post revascularization management.  IMPRESSION: Status post endovascular revascularization of occluded basilar artery with 1 pass with a 3 mm x 33 mm Trevo ProVue retrieval device, and placement of a 4 mm x 30 mm Enterprise vascular stent reconstruct with achievement of a TICI 2C revascularization as mentioned above.  PLAN: Follow-up in the clinic 4 weeks post discharge.  Patient to remain on aspirin 81 mg a day, and Brilinta 90 mg b.i.d.   Electronically Signed   By: Luanne Bras M.D.   On: 10/15/2019 10:05    IR ANGIO VERTEBRAL SEL SUBCLAVIAN INNOMINATE UNI L MOD SED  Result Date: 10/14/2019 INDICATION: Gradual decline in mentation with fall and left-sided weakness and severe dysarthria. 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  EXAM: 1. EMERGENT LARGE VESSEL OCCLUSION THROMBOLYSIS (POSTERIOR CIRCULATION)  COMPARISON:  CT angiogram of the head and neck of October 14, 2019.  MEDICATIONS: Ancef 2 g IV antibiotic was administered within 1 hour of the procedure.  ANESTHESIA/SEDATION: General anesthesia.  CONTRAST:  Isovue 300 approximately 150 mL.  FLUOROSCOPY TIME:  Fluoroscopy Time: 117 minutes 12 seconds (3624 mGy).  COMPLICATIONS: None immediate.  TECHNIQUE: Following a full explanation of the procedure along with the potential associated complications, an informed witnessed consent was obtained from the patient's spouse. The risks of intracranial hemorrhage of 10%, worsening neurological deficit, ventilator dependency, death and inability to revascularize were all reviewed in detail with the patient's spouse.  The patient was then put under general anesthesia by the Department of Anesthesiology at [facility].  The right groin was prepped and draped in the usual sterile fashion. Thereafter using modified Seldinger technique, transfemoral access into the right common femoral artery was obtained without difficulty. Over a 0.035 inch guidewire a 5 French Pinnacle sheath was inserted. Through this, and also over a 0.035 inch guidewire a 5 [place] JB 1 catheter was advanced to the aortic arch region and selectively positioned in the innominate artery, the right vertebral artery, the right common carotid artery and the left vertebral artery.  The right radial artery was identified and evaluated with ultrasound and documented. Using ultrasound guidance, right radial access was then obtained using a micropuncture set. Over a 0.018 inch micro  guidewire, a 6/7 radial sheath was then inserted without difficulty. The obturator, and the micro guidewire were removed. Good aspiration was obtained from the side port of the sheath. A cocktail of 2000 units of heparin, 2.5 mg of verapamil, and 200 mcg of nitroglycerin was then infused in diluted form without event.  Over a 0.035 Roadrunner guidewire, a Benchmark 95 cm 6 [place] guide catheter was then advanced and a 5 [place] catheter, and advanced into the proximal right vertebral artery.  The 5 [place] diagnostic catheter were retrieved and removed. The Benchmark 6 [place] guide catheter was then advanced into the proximal 1/3 of the right vertebral artery.  The guidewire was removed. Good aspiration obtained from the hub of the Benchmark guide catheter. A control arteriogram performed through Benchmark micro guide catheter demonstrated antegrade flow to the distal right vertebral artery.  FINDINGS: The innominate arteriogram demonstrates the origins of the right subclavian artery and the right common carotid artery to be widely patent.  The right vertebral artery origin demonstrates mild stenosis.  The vessel is seen to opacify to the cranial skull base.  Wide patency is seen of the right vertebrobasilar junction proximal to  the right posterior-inferior cerebellar artery.  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  A string sign is demonstrated in the occluded basilar artery proximally. The right common carotid arteriogram demonstrates the right external carotid artery and its major branches to be widely patent. The right internal carotid artery at the bulb to the cranial skull base demonstrates wide patency.  Wide patency is seen of the petrous, the cavernous and the supraclinoid segments.  Atherosclerotic  narrowing is seen of the cavernous segment of the right internal carotid artery and the proximal carpal cavernous segment and the distal cavernous segment. The supraclinoid right ICA is widely patent.  The right middle and the right anterior cerebral arteries opacify into the capillary and venous phases. The left vertebral artery origin is from the aortic arch between the origin of the left subclavian artery and the left common carotid artery.  The vessel is seen to opacify to the cranial skull base where wide patency is seen of the left posterior-inferior cerebellar artery. 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  PROCEDURE: The diagnostic JB 1 catheter initially in the distal right subclavian artery was then exchanged over a 0.035 inch 300 cm Rosen exchange guidewire for a 5 [place] Pinnacle sheath in the right groin which was connected to continuous heparinized saline infusion.  An 8 French 80 cm Neuron Max sheath was then positioned just proximal to the origin of the right vertebral artery followed by the removal of the exchange guidewire.  Over a 0.035 inch Roadrunner guidewire, a 6 French 132 cm Catalyst guide catheter was then advanced to the distal end of the Neuron Max sheath. The guidewire was then advanced into the proximal 1/3 of the right vertebral artery followed by the Catalyst guide catheter in the proximal 1/3.  However, more distal advancement of the system was met with significant resistance on account of the tortuosity of the aortic arch with herniation into the aortic arch. Further attempts were then made using a Benchmark 6 [place] guide catheter positioned in the proximal right vertebral artery.  An angiogram obtained through this demonstrated multiple focal areas of irregularity with mild-to-moderate narrowing persisting into the distal right vertebral artery at the cranial skull  base.  A [place] 027 microcatheter was then advanced through the [facility] guide catheter over a 0.014 inch standard Synchro micro guidewire and then over a 0.016 inch Fathom micro guidewire. The microcatheter could be advanced to the level of the cranial skull base.  However, further advancements again were met with significant proximal herniation of the platform into the aortic arch region. After multiple attempts, the femoral approach was then abandoned.  The right radial artery was then accessed using the 6 French 95 cm Benchmark guide catheter which was positioned in the proximal 1/3 of the right vertebral artery as described above.  Over a 0.014 inch standard Synchro micro guidewire, an 021 Trevo ProVue microcatheter was then advanced using biplane roadmap technique and constant fluoroscopic guidance to the right vertebrobasilar junction.  Access through the occluded basilar artery was then obtained gradually, slowly and deliberately until the micro guidewire was advanced without difficulty into the right posterior cerebral artery P1 P2 junction followed by the microcatheter.  The guidewire was removed. Good aspiration was obtained from the hub of  the microcatheter. A gentle control arteriogram performed through the microcatheter demonstrated safe position of tip of the microcatheter.  A 3 mm x 33 mm Trevo ProVue retrieval device was then advanced to the distal end of the microcatheter.  The O ring on the delivery microcatheter was loosened. With slight forward gentle traction with the right hand on the delivery micro guidewire the retrieval device was then deployed extending from the proximal P1 segment all the way to the proximal basilar artery. A control arteriogram performed through the Benchmark guide catheter in the right vertebral artery demonstrated revascularization of the occluded basilar artery with now patency noted of the basilar artery, the posterior cerebral arteries bilaterally, and  the superior cerebellar arteries. Also demonstrated is patency of the proximal basilar artery and of the perforators and the left anterior-inferior cerebellar artery and the hypoplastic right anterior inferior cerebellar artery.  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  Mild-to-moderate disease of the distal basilar artery angiographically was also evident.  The retrieval device was then recaptured with the Trevo ProVue microcatheter and the microcatheter again advanced into the P1 right middle posterior cerebral artery. The guidewire was removed. Good aspiration obtained from the hub of the microcatheter which was then connected to continuous heparinized saline infusion.  It was elected to revascularize the occluded basilar artery by placement of a stent reconstructed device. A 4 mm x 30 mm Enterprise vascular device was advanced to the distal end of the microcatheter.  The O ring on the delivery microcatheter was loosened. With slight forward gentle traction with the right hand on the delivery micro guidewire, with the left hand the delivery microcatheter was retrieved such that the distal landing zone was at the junction of the distal and the middle 1/3 of the basilar artery and the proximal being in the distal dominant right vertebrobasilar junction.  Following deployment of the stent, the delivery micro guidewire and the microcatheter were retrieved proximally. A control arteriogram performed through the Benchmark guide catheter in the vertebral artery now demonstrated complete revascularization of the basilar artery, the posterior cerebral arteries, the superior cerebellar arteries and the anterior-inferior cerebellar arteries.  There continued to be reasonable patency of the basilar artery in the stented and distal segments. Again evident was the extensive arteriosclerotic disease. At this time the  patient was given 81 mg of aspirin, and 180 mg of Brilinta via an orogastric tube. Also the patient was given 6 mg of intra-arterial Integrilin via the Benchmark guide catheter in order to inhibit platelet aggregation within the stent.  Control arteriograms were then performed at 15 and 40 minutes post deployment of the stent. These continued to demonstrate excellent flow through the stented segment of the basilar artery with perfusion of the posterior cerebral arteries, the superior cerebellar arteries and the anterior-inferior cerebellar arteries.  There continued to be approximately 60% to 70% stenosis of the site of the previous occlusion.  Given the brisk antegrade flow over this period of time without evident platelet aggregation it was elected to defer further endovascular intervention at this time.  A final control arteriogram performed through the 6 [place] Benchmark guide catheter in the right vertebral artery continued to demonstrate wide patency of the right vertebral artery proximal and intracranially.  This was then retrieved and removed over a 0.035 Roadrunner guidewire.  The right radial puncture site was then sealed with a wrist band for hemostasis. The distal right radial pulse  was then verified to be present.  The patient was left intubated on account of his neurological dysfunction and to protect his airway.  He was then transferred to the neuro ICU to continue with post revascularization management.  IMPRESSION: Status post endovascular revascularization of occluded basilar artery with 1 pass with a 3 mm x 33 mm Trevo ProVue retrieval device, and placement of a 4 mm x 30 mm Enterprise vascular stent reconstruct with achievement of a TICI 2C revascularization as mentioned above.  PLAN: Follow-up in the clinic 4 weeks post discharge.  Patient to remain on aspirin 81 mg a day, and Brilinta 90 mg b.i.d.   Electronically Signed   By: Luanne Bras M.D.   On: 10/15/2019 10:05        HISTORY OF PRESENT ILLNESS Larry Beltran is a 86 y.o. male with a history of esophageal cancer who presents with left-sided weakness.  He was in his normal state of health at 10 PM on 10/13/2019 when he went to bed.  Per his wife, he attends to all of his daily activities of living.  Around 2 AM, he awoke and fell out of bed.  His wife heard him and when she went to attend to him she called 911.  Code stroke was activated en route.  He was not candidate for IV TPA due to being outside the window, but a CT A/P revealed basilar occlusion.  CT perfusion suggestive of a potential salvageable area of ischemia (though not as reliable in the posterior circulation) and given the high mortality of untreated vascular thrombosis, the option of intervention was discussed with the family.  His wife elected to proceed. Modified Rankin Scale: 1.    HOSPITAL COURSE [name] is a 86 y.o. male with history of esophageal cancer presenting with L sided weakness after a fall. Taken to IR for BA occlusion.   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  Code Stroke CT head No acute abnormality. ASPECTS 10.     CTA head & neck proximal BA occlusion and R PCA occlusion. High-grade L P2 and L V4 stenoses.   CT perfusion abnormal level of upper pons  Cerebral angio proximal BA occlusion s/p stent for intracranial atherosclerosis   Post IR CT no evidence of ICH or mass effect or vent prominence  MRI  Large central pontine infarct. Scattered R cerebellar infarcts. Old R lateral pontine infarct. Small vessel disease.   2D Echo EF 65-70%. No source of embolus     LDL 129    HgbA1c 6.6    P2Y12 = 126  no antithrombotics prior to admission,was on aspirin 81 mg daily and Brilinta (ticagrelor) 90 mg bid -> off DAPT for comfort care measures   DNR now - family requested comfort  care measures after family meeting   Social worker obtained residential Hospice placement and pt transferred for ongoing care  Acute Respiratory Failure  Intubated for IR  Left intubated for airway protection  Extubated for comfort care measures 10/18/19  Comfort care measures  Aspiration PNA Possible Central Fever  Tmax 102.3->99.4->101.2->afebrile  WBC 7.7->6.6->4.3->3.4  CXR - 10/17/19 - apparent pneumonia right base  Blood culture - NGTD   Empirical treatment with Unasyn 1/9>>1/11, d/c for comfort care measures  Acute blood loss anemia post IR  Hgb 11.9->9.5->8.2->7.8->7.6   Iron 8, TIBC 168, Sat 5% and ferritin  NML  Hx GIB yrs ago  Dysphagia Severe Malnutrition  Secondary to stroke  NPO - treated with TF in the hospital  Now in comfort feeds  Other Stroke Risk Factors   Advanced age  Former smoker, quit 1979  Former smokeless tobacco user  Family hx stroke (father)  Other Active Problems  Esophageal cancer T2 N0 M0 adenocarcinoma 2019 w/ recurrence and T2 lesion not amenable to resection - currently undergoing chemotherapy with FOLFOX and Herceptin at [facility]  Thrombocytopenia - 173->140->->117->111  Hypokalemia 3.8->3.2    DISCHARGE EXAM Blood pressure 137/68, pulse 86, temperature 99.6 F (37.6 C), temperature source Axillary, resp. rate 17, height _0  (1.676 m), weight 62 kg, SpO2 92 %. Patient is extubated for comfort care.  Not in distress. Afebrile. Head is nontraumatic. Neck is supple without bruit.    Cardiac exam no murmur or gallop. Lungs are clear to auscultation. Distal pulses are well felt. Neurological Exam :  Patient is extubated, in comfort care, exam limited. Eyes are closed but easily open on voice calling his name. He is able to keep eyes open but not following commands, no horizontal movement as requested, not moving up or down as requested, spontaneous blinking eyes, but inconsistently blinking to visual threat. He has  spontaneous mouth movement but non purposeful. Pupils are equal reactive. He quadriplegic and does not have any voluntary movements in all 4 extremities.  He has mild withdraw to all extremities, BUEs with increased extensor tone and extension posturing following mild withdraw with pain stimulation. BLEs mild withdrawal, left more than right. Significant triple reflexes and babinski bilaterally.  Sensation, coordination and gait not tested.  Discharge Diet   NPO, comfort feeds if awake enough  DISCHARGE PLAN  Disposition:  [place] w/ [facility] for ongoing comfort care  35 minutes were spent preparing discharge.  Rosalin Hawking, MD PhD Stroke Neurology 10/19/2019 7:22 PM

## 2019-10-19 NOTE — Progress Notes (Signed)
[place] place for report but they saisd the staff is in huddle. Left phone no for them to call.

## 2019-10-19 NOTE — Plan of Care (Signed)
  Problem: Education: Goal: Knowledge of General Education information will improve Description: Including pain rating scale, medication(s)/side effects and non-pharmacologic comfort measures Outcome: Not Progressing   Problem: Health Behavior/Discharge Planning: Goal: Ability to manage health-related needs will improve Outcome: Not Progressing   Problem: Clinical Measurements: Goal: Ability to maintain clinical measurements within normal limits will improve Outcome: Not Progressing Goal: Will remain free from infection Outcome: Not Progressing Goal: Diagnostic test results will improve Outcome: Not Progressing Goal: Respiratory complications will improve Outcome: Not Progressing Goal: Cardiovascular complication will be avoided Outcome: Not Progressing   Problem: Activity: Goal: Risk for activity intolerance will decrease Outcome: Not Progressing   Problem: Nutrition: Goal: Adequate nutrition will be maintained Outcome: Not Progressing   Problem: Coping: Goal: Level of anxiety will decrease Outcome: Not Progressing   Problem: Elimination: Goal: Will not experience complications related to bowel motility Outcome: Not Progressing Goal: Will not experience complications related to urinary retention Outcome: Not Progressing   Problem: Pain Managment: Goal: General experience of comfort will improve Outcome: Not Progressing   Problem: Safety: Goal: Ability to remain free from injury will improve Outcome: Not Progressing   Problem: Skin Integrity: Goal: Risk for impaired skin integrity will decrease Outcome: Not Progressing   Problem: Education: Goal: Knowledge of disease or condition will improve Outcome: Not Progressing Goal: Knowledge of secondary prevention will improve Outcome: Not Progressing Goal: Knowledge of patient specific risk factors addressed and post discharge goals established will improve Outcome: Not Progressing Goal: Individualized Educational  Video(s) Outcome: Not Progressing   Problem: Coping: Goal: Will verbalize positive feelings about self Outcome: Not Progressing Goal: Will identify appropriate support needs Outcome: Not Progressing   Problem: Health Behavior/Discharge Planning: Goal: Ability to manage health-related needs will improve Outcome: Not Progressing   Problem: Self-Care: Goal: Ability to participate in self-care as condition permits will improve Outcome: Not Progressing Goal: Verbalization of feelings and concerns over difficulty with self-care will improve Outcome: Not Progressing Goal: Ability to communicate needs accurately will improve Outcome: Not Progressing   Problem: Nutrition: Goal: Risk of aspiration will decrease Outcome: Not Progressing Goal: Dietary intake will improve Outcome: Not Progressing   Problem: Ischemic Stroke/TIA Tissue Perfusion: Goal: Complications of ischemic stroke/TIA will be minimized Outcome: Not Progressing   

## 2019-10-19 NOTE — Progress Notes (Signed)
Patient discharged to [facility] via [place].  Documentation given to [facility] staff and report given earlier by day shift RN.

## 2019-10-19 NOTE — TOC Initial Note (Signed)
Transition of Care [facility]) - Initial/Assessment Note    Patient Details  Name: Larry Beltran MRN: [id number] Date of Birth: 08-09-1932  [facility] CM/SW Contact:    Alexander Mt, [place] Phone Number: 10/19/2019, 10:54 AM  Clinical Narrative:                 CSW met with pt wife Pat at bedside. Introduced self, role, reason for visit. Provided verbal support for pt wife as to the hard decisions that they have had to make over the past few days. I discussed that the stroke team would like for pt to be at a place where true comfort care could be provided. Confirmed home address and offered choice- pt wife states that since they live almost in [place] that pt placement in [place] would both be a bit of a drive- but she prefers [facility] in [place]. She did express that she had been told that this was a hospice unit at the hospital and thought pt could stay. CSW provided further support and we discussed reasoning for transfer.   Pt wife with multiple questions regarding the room and coverage for hospice- discussed that the liaison for hospice can provide further answers. Referral made to Bevely Palmer, RN liaison with [facility] and [facility]. Pt wife to go home- states she can be reached on her cell phone or house phone; both provided to Bevely Palmer with [employer].   Expected Discharge Plan: [place] Barriers to Discharge: Hospice Bed not available   Patient Goals and CMS Choice Patient states their goals for this hospitalization and ongoing recovery are:: for him to be comfortable CMS Medicare.gov Compare Post Acute Care list provided to:: Patient Represenative (must comment)(spouse) Choice offered to / list presented to : Spouse  Expected Discharge Plan and Services Expected Discharge Plan: [place] In-house Referral: Clinical Social Work Discharge Planning Services: CM Consult Post Acute Care Choice: Hospice Living  arrangements for the past 2 months: Single Family Home  Prior Living Arrangements/Services Living arrangements for the past 2 months: Single Family Home Lives with:: Spouse Patient language and need for interpreter reviewed:: Yes(no needs) Do you feel safe going back to the place where you live?: No   pt wife states she doesnt think she can do hospice care at home  Need for Family Participation in Patient Care: Yes (Comment)(decision making) Care giver support system in place?: Yes (comment)(pt spouse and adult children)   Criminal Activity/Legal Involvement Pertinent to Current Situation/Hospitalization: No - Comment as needed  Permission Sought/Granted Permission sought to share information with : Family Supports Permission granted to share information with : No(pt is comfort care)  Share Information with NAME: Willia Lampert  Permission granted to share info w AGENCY: [place] granted to share info w Relationship: spouse  Permission granted to share info w Contact Information: [phone] (home), [phone] (spouse)  Emotional Assessment Appearance:: Appears stated age Attitude/Demeanor/Rapport: Lethargic, Unresponsive, Unable to Assess Affect (typically observed): Unable to Assess Orientation: : Fluctuating Orientation (Suspected and/or reported Sundowners) Alcohol / Substance Use: Not Applicable Psych Involvement: No (comment)  Admission diagnosis:  Fall [W19.XXXA] Stroke [facility]) [I63.9] Stroke (cerebrum) ([place]) [I63.9] Acute ischemic stroke [facility]) [I63.9] 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 Protein-calorie malnutrition, severe 10/15/2019  . Stroke (cerebrum) ([place]) 10/14/2019  . 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   PCP:  Ala Dach, MD Pharmacy:   [id number]  [place], [place] - [address] [place] [address] Phone: [phone] Fax:  [phone]   Readmission Risk Interventions Readmission Risk Prevention Plan 10/19/2019  Transportation Screening Not Complete  Transportation Screening Comment plan for residential hospice  PCP or Specialist Appt within 5-7 Days Not Complete  Not Complete comments plan for residential hospice  Home Care Screening Not Complete  Home Care Screening Not Completed Comments plan for residential hospice  Medication Review (RN CM) Referral to Pharmacy

## 2019-10-19 NOTE — TOC Transition Note (Signed)
Transition of Care [facility]) - CM/SW Discharge Note   Patient Details  Name: Larry Beltran MRN: [id number] Date of Birth: [birth year]  Transition of Care [facility]) CM/SW Contact:  Alexander Mt, [place] Phone Number: 10/19/2019, 11:41 AM   Clinical Narrative:    Bed available at [facility] per liaison Venia Carbon. She will coordinate with pt family to complete paperwork and this writer has requested DNR form to be signed and discharge summary/order to be placed.   TOC team continues to follow as needed to facilitate discharge to hospice home.      Barriers to Discharge: Barriers Resolved   Patient Goals and CMS Choice Patient states their goals for this hospitalization and ongoing recovery are:: for him to be comfortable CMS Medicare.gov Compare Post Acute Care list provided to:: Patient Represenative (must comment)(spouse) Choice offered to / list presented to : Spouse  Discharge Placement   Patient chooses bed at: Other - please specify in the comment section below:([facility]) Patient to be transferred to facility by: [place] Name of family member notified: pt wife/pt son Patient and family notified of of transfer: 10/19/19  Discharge Plan and Services In-house Referral: Clinical Social Work Discharge Planning Services: CM Consult Post Acute Care Choice: Hospice            Readmission Risk Interventions Readmission Risk Prevention Plan 10/19/2019  Transportation Screening Not Complete  Transportation Screening Comment plan for residential hospice  PCP or Specialist Appt within 5-7 Days Not Complete  Not Complete comments plan for residential hospice  Home Care Screening Not Complete  Home Care Screening Not Completed Comments plan for residential hospice  Medication Review (RN CM) Referral to Pharmacy

## 2019-10-19 NOTE — Progress Notes (Signed)
Manufacturing engineer (ACC)  Consents obtained, explained services and answered questions.  [facility] is ready for this pt, please call report to [phone].  Room will be assigned at this time.  Venia Carbon RN, BSN, CCRN

## 2019-10-19 NOTE — Social Work (Signed)
Secure message received from Larry Sabin, NP with stroke team.  Requesting referral to hospice home. At this time pt family has not expressed preference- will offer choice to pt family. HIPAA compliant message left for pt wife at home number.  Was informed after by pt RN that pt wife Larry Beltran is at bedside, will meet with her to provide support and referral.  Larry Beltran, MSW, [place] Work

## 2019-10-19 NOTE — Social Work (Signed)
Clinical Social Worker facilitated patient discharge including contacting patient family and facility to confirm patient discharge plans.  Clinical information faxed to facility and family agreeable with plan.  CSW arranged ambulance transport via [facility] to [facility] RN to call [phone] with report prior to discharge.  Clinical Social Worker will sign off for now as social work intervention is no longer needed. Please consult us again if new need arises.  Merla Sawka, MSW, LCSWA Clinical Social Worker   

## 2019-10-19 NOTE — Progress Notes (Signed)
Manufacturing engineer  Referral received for residential hospice at [facility].  Will reach out to family to confirm and update TOC manger once bed availability has been determined.  Venia Carbon RN, BSN, CCRN [place] Liaison (in [phone]

## 2019-10-19 NOTE — Progress Notes (Signed)
Report given to Arbie Cookey, Therapist, sports at [facility] place

## 2019-10-24 LAB — CULTURE, BLOOD (ROUTINE X 2)
Culture: NO GROWTH
Culture: NO GROWTH
Special Requests: ADEQUATE

## 2019-11-08 DEATH — deceased

## 2020-01-18 ENCOUNTER — Other Ambulatory Visit: Payer: Self-pay

## 2020-01-18 NOTE — Patient Outreach (Signed)
First telephone outreach attempt to obtain mRS. No answer. Left message for returned call.  Ina Homes [facility] Management Assistant [phone]

## 2020-01-26 ENCOUNTER — Other Ambulatory Visit: Payer: Self-pay

## 2020-01-26 NOTE — Patient Outreach (Signed)
Second telephone outreach attempt to obtain mRS. No answer. Left message for returned call.  Deric Bocock [facility] Management Assistant [phone] 

## 2020-01-31 ENCOUNTER — Other Ambulatory Visit: Payer: Self-pay

## 2020-01-31 NOTE — Patient Outreach (Signed)
3 outreach attempts were completed to obtain mRs. mRs could not be obtained because patient never returned my calls. mRs=7    [facility] Management Assistant [phone]

## 2020-05-30 IMAGING — DX DG CHEST 1V PORT
1 series · 1 of 1 positions shown · non-contrast
Comparison: None.

CLINICAL DATA: ETT placement

EXAM:
PORTABLE CHEST 1 VIEW

[chest ap]
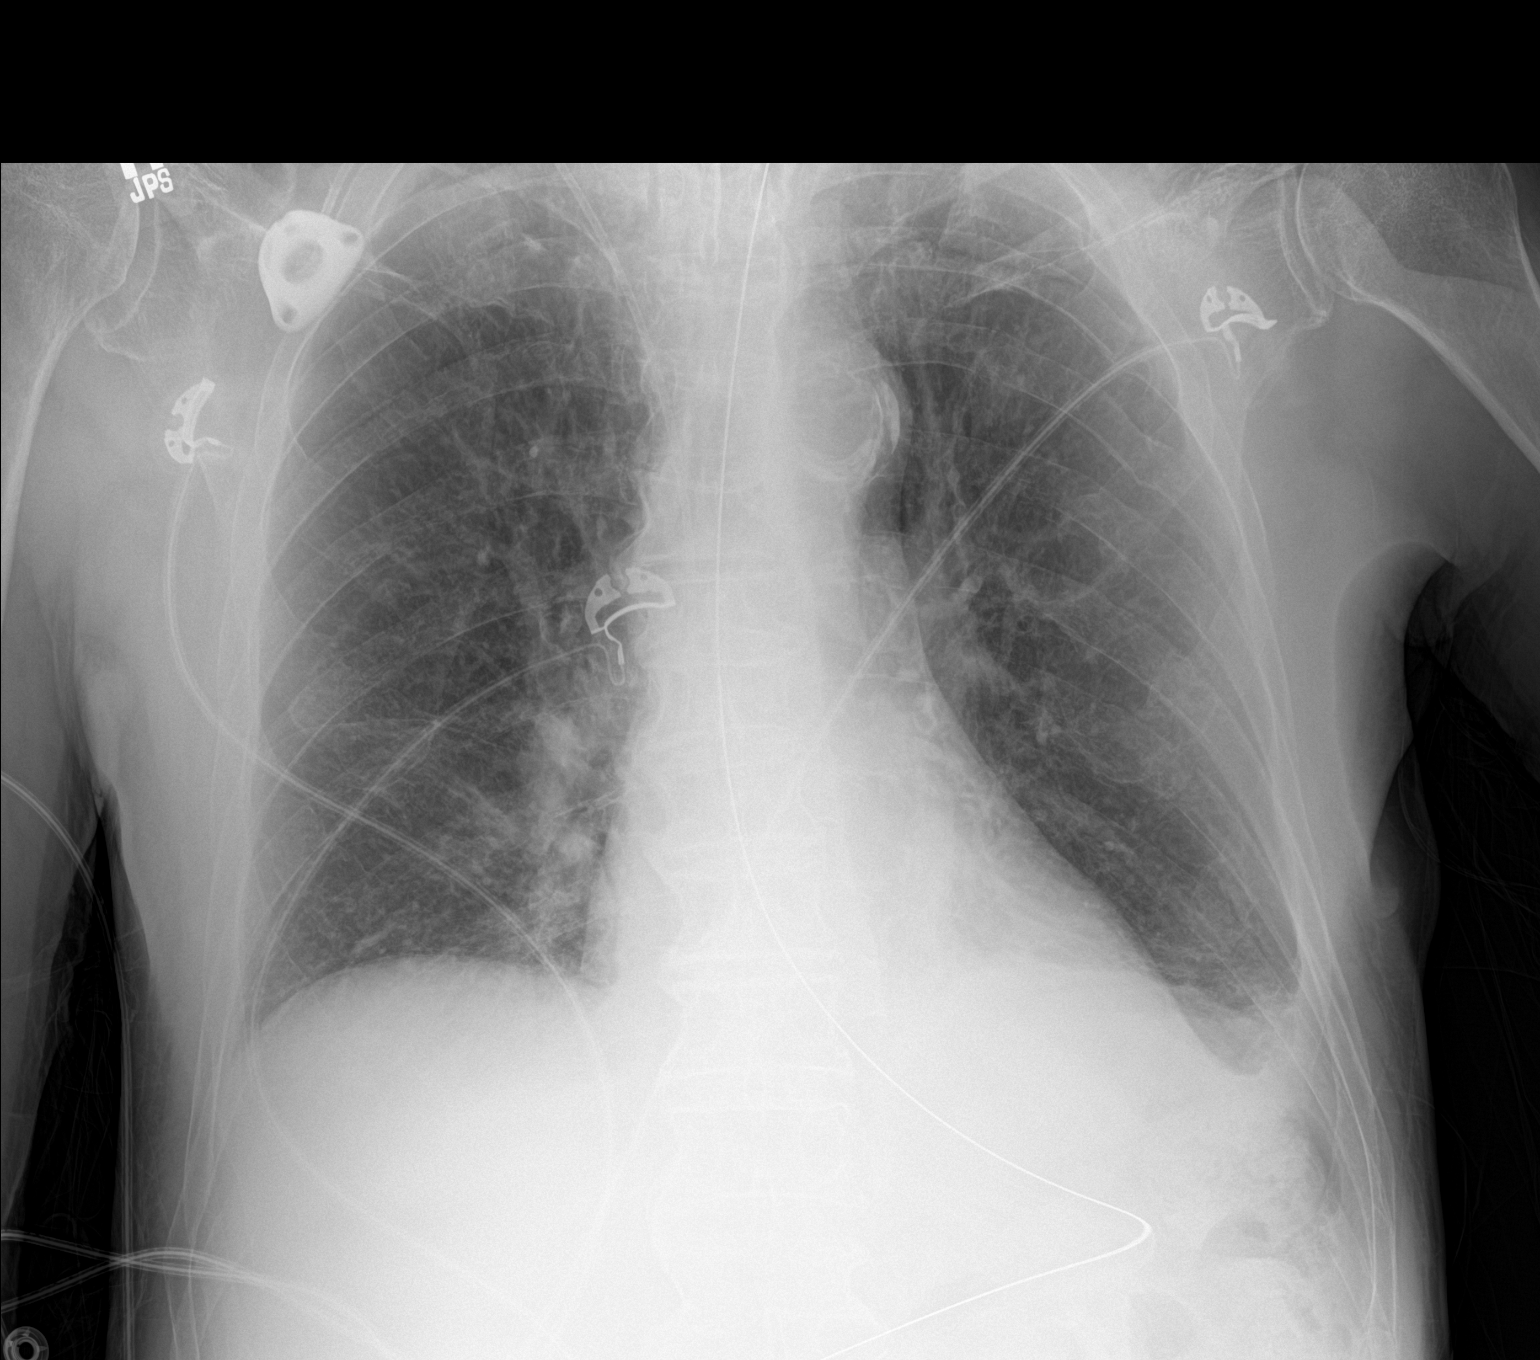

[1 of 1 positions shown; findings below may reference images not displayed]

FINDINGS: Endotracheal tube with the tip 6 cm above carina. NG tube coursing
below the diaphragm. Port a cath with the tip projecting over the
SVC.

Bilateral mild interstitial thickening. Hazy right lower lobe
airspace disease. Trace left pleural effusion. Normal heart size.
Opacity in the upper mediastinum and partially projecting over the
right lung apex. Thoracic aortic atherosclerosis. No aggressive
osseous lesion.
IMPRESSION: 1. Support lines and tubing as described above.
2. Opacity in the upper mediastinum and partially projecting over
the right lung apex likely artifactual.
3. Trace left pleural effusion. Hazy right lower lobe airspace
disease which may reflect atelectasis versus pneumonia.
4.

## 2020-05-30 IMAGING — CT CT HEAD CODE STROKE
4 series · 17 of 47 positions shown, 19 images · non-contrast
Comparison: None.

CLINICAL DATA: Code stroke.  Slurred speech and left-sided weakness

EXAM:
CT HEAD WITHOUT CONTRAST
TECHNIQUE: Contiguous axial images were obtained from the base of the skull
through the vertex without intravenous contrast.

[Series 3: head without · axial · non-contrast · 0.42mm/px · z∈[-94,+26]mm · 6 of 34 slices shown, 8 images]
[im 5/34  brain]
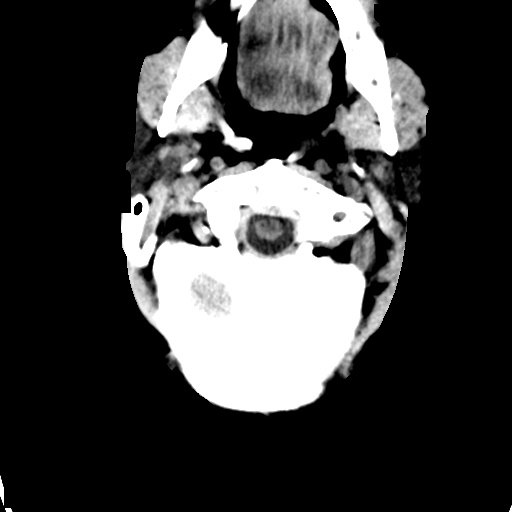
[im 5/34  bone]
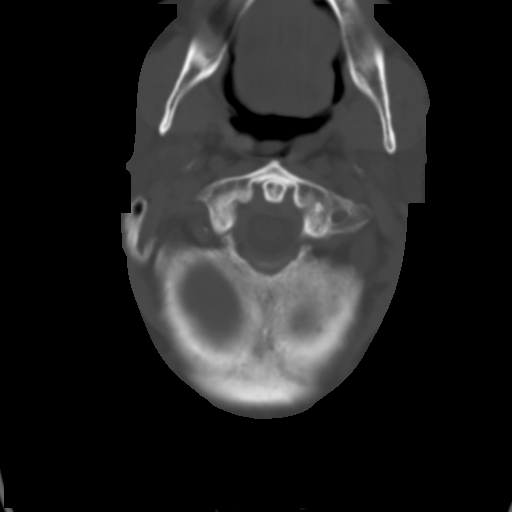
[im 10/34  brain]
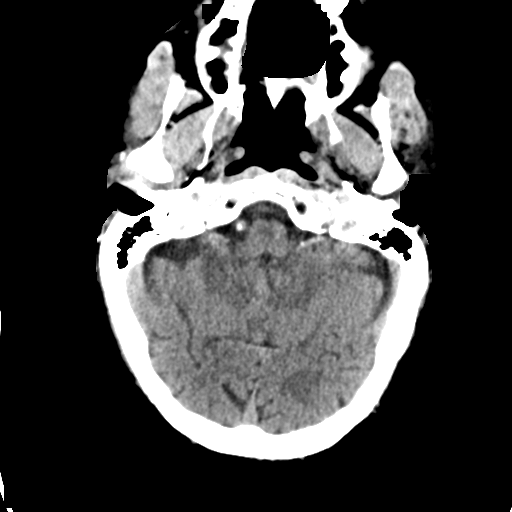
[im 15/34  brain]
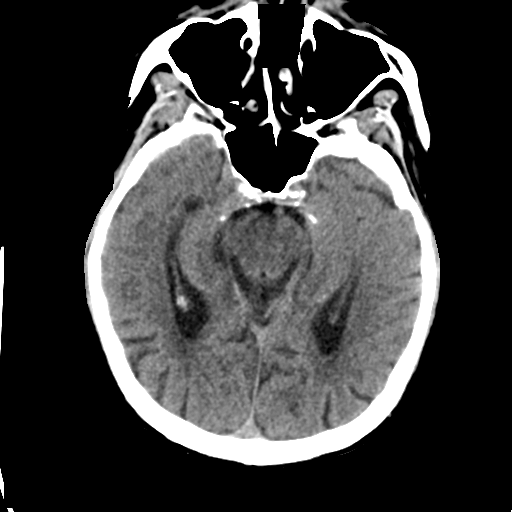
[im 19/34  brain]
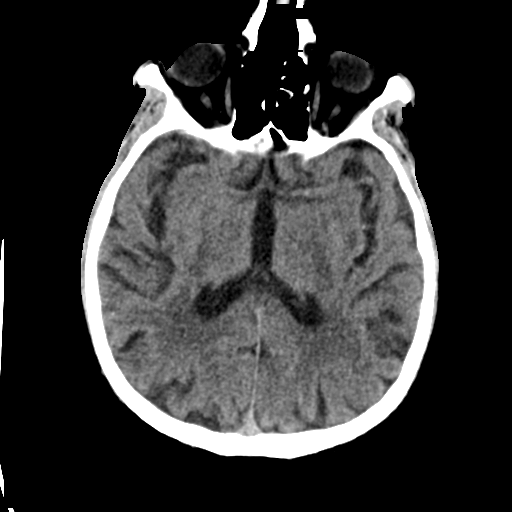
[im 24/34  brain]
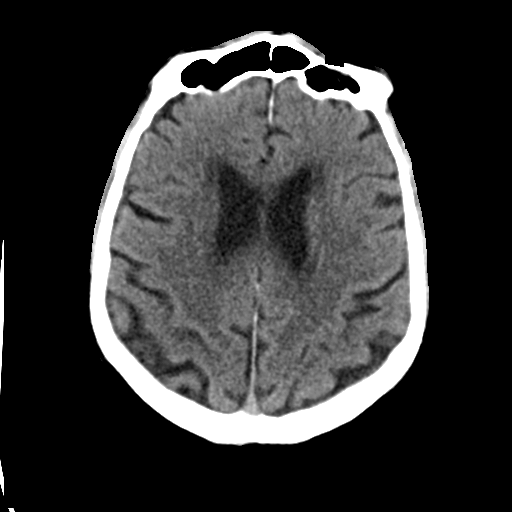
[im 24/34  bone]
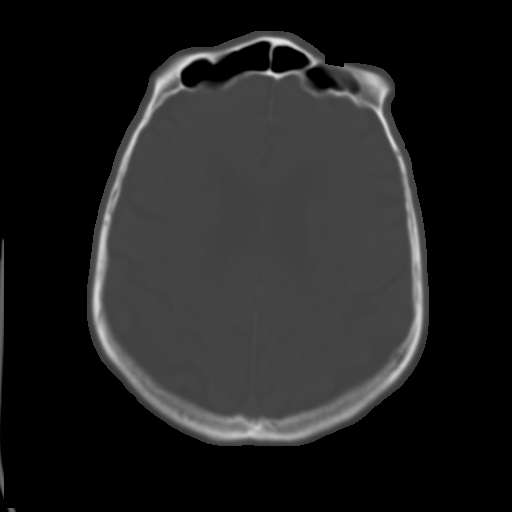
[im 29/34  brain]
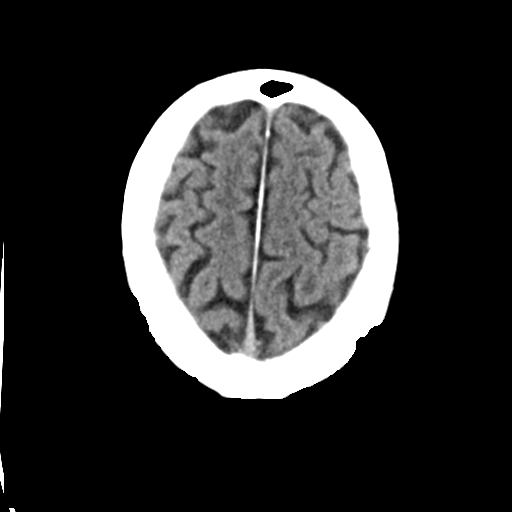

[Series 4: head bone · axial · 0.42mm/px · z∈[-98,-18]mm · 5 of 86 slices shown]
[im 9/86  bone]
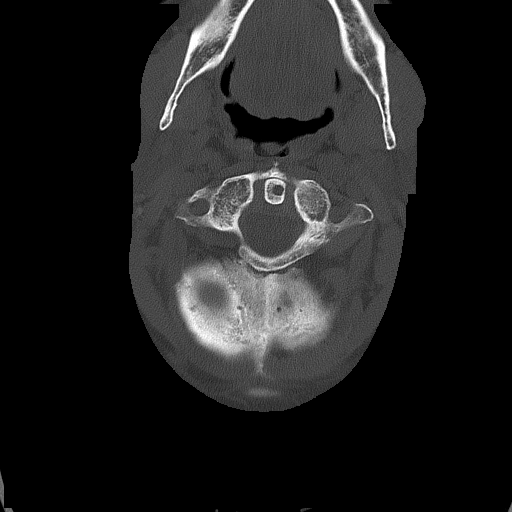
[im 17/86  bone]
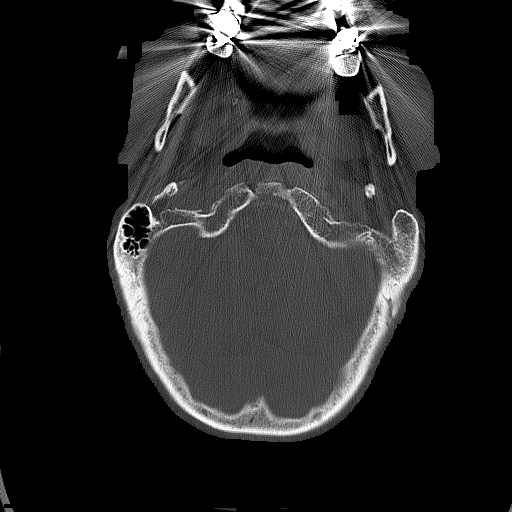
[im 29/86  bone]
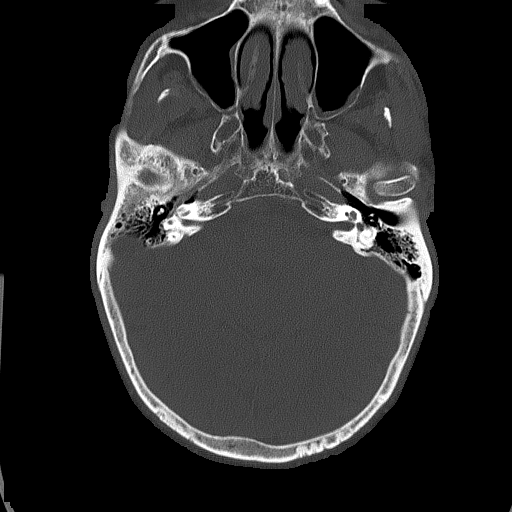
[im 37/86  bone]
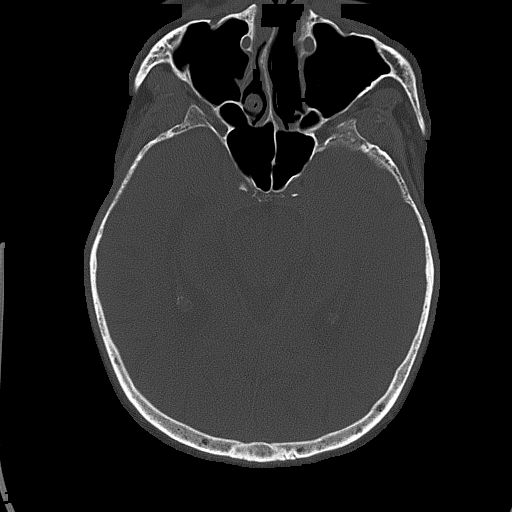
[im 49/86  bone]
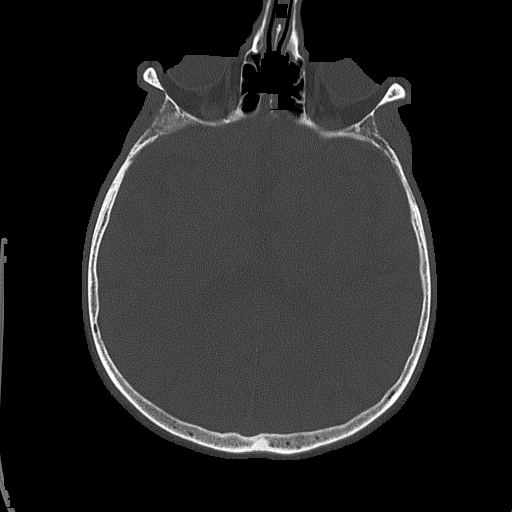

[Series 5: head without cor · coronal · non-contrast · 0.37mm/px · 3 of 73 slices shown]
[im 25/73  brain]
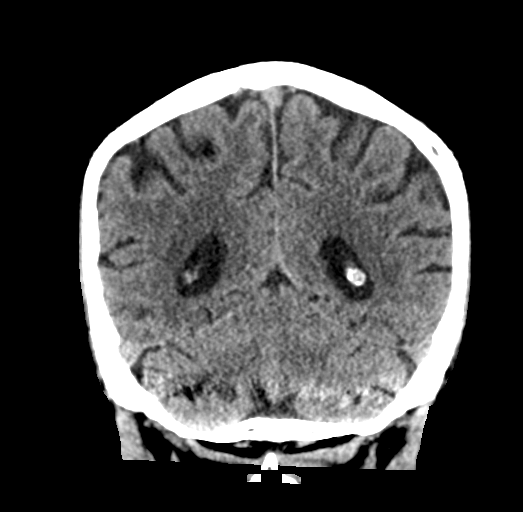
[im 33/73  brain]
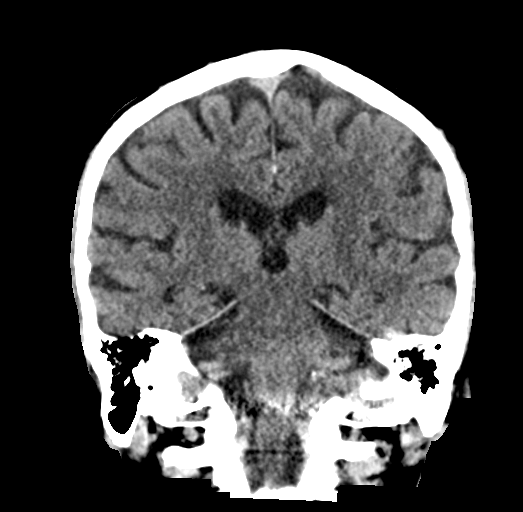
[im 41/73  brain]
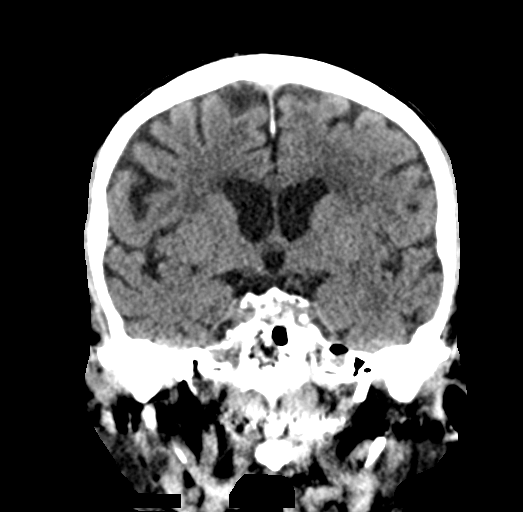

[Series 6: head without sag · sagittal · non-contrast · 0.36mm/px · 3 of 67 slices shown]
[im 23/67  brain]
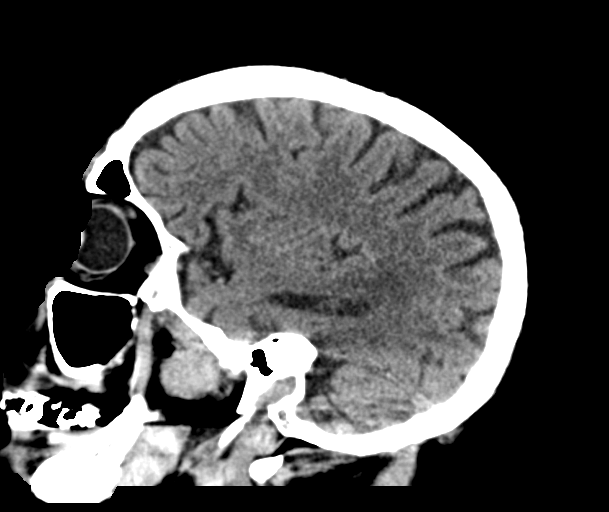
[im 34/67  brain]
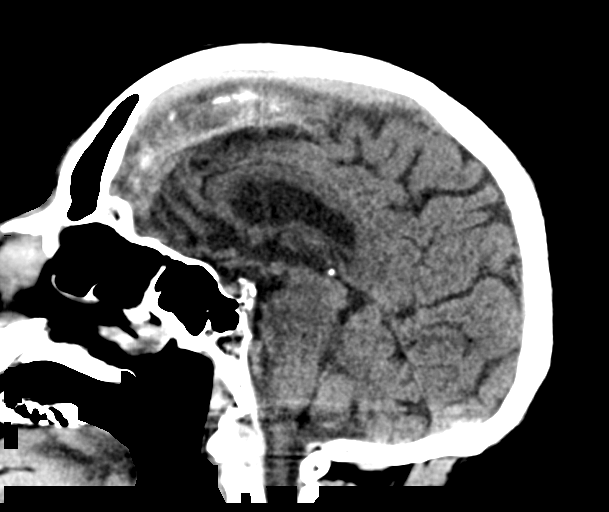
[im 45/67  brain]
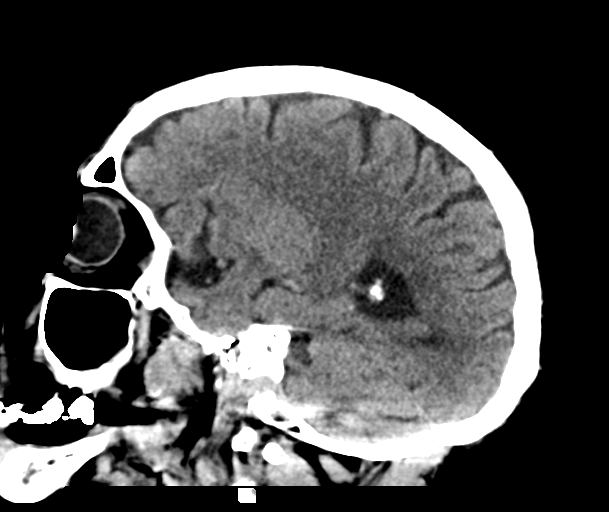

[17 of 47 positions shown; findings below may reference images not displayed]

FINDINGS: Brain: No evidence of acute infarction, hemorrhage, hydrocephalus,
extra-axial collection or mass lesion/mass effect. Mild chronic
small vessel ischemia and volume loss for age.

Vascular: No hyperdense vessel or unexpected calcification.

Skull: Normal. Negative for fracture or focal lesion.

Sinuses/Orbits: Negative

Other: These results were communicated to Dr. Taweng at [DATE]
[place] 10/14/2019by text page via the [employer] messaging system.

ASPECTS ([place] Stroke Program Early CT Score)

- Ganglionic level infarction (caudate, lentiform nuclei, internal
capsule, insula, M1-M3 cortex): 7

- Supraganglionic infarction (M4-M6 cortex): 3

Total score (0-10 with 10 being normal): 10
IMPRESSION: No acute finding.  ASPECTS is 10.
